# Patient Record
Sex: Female | Born: 1975 | Race: White | Hispanic: No | Marital: Married | Smoking: Never smoker
Health system: Southern US, Community
[De-identification: ages and names within clinical notes are randomized; demographics above are authoritative.]

## PROBLEM LIST (undated history)

## (undated) DIAGNOSIS — Z87448 Personal history of other diseases of urinary system: Secondary | ICD-10-CM

## (undated) DIAGNOSIS — Z8619 Personal history of other infectious and parasitic diseases: Secondary | ICD-10-CM

## (undated) DIAGNOSIS — K219 Gastro-esophageal reflux disease without esophagitis: Secondary | ICD-10-CM

## (undated) HISTORY — PX: OTHER SURGICAL HISTORY: SHX169

## (undated) HISTORY — DX: Gastro-esophageal reflux disease without esophagitis: K21.9

## (undated) HISTORY — DX: Personal history of other infectious and parasitic diseases: Z86.19

## (undated) HISTORY — PX: DILATION AND CURETTAGE OF UTERUS: SHX78

## (undated) HISTORY — PX: MOUTH SURGERY: SHX715

## (undated) HISTORY — DX: Personal history of other diseases of urinary system: Z87.448

---

## 2000-10-13 ENCOUNTER — Other Ambulatory Visit: Admission: RE | Admit: 2000-10-13 | Discharge: 2000-10-13 | Payer: Self-pay | Admitting: Obstetrics and Gynecology

## 2001-10-28 ENCOUNTER — Other Ambulatory Visit: Admission: RE | Admit: 2001-10-28 | Discharge: 2001-10-28 | Payer: Self-pay | Admitting: Obstetrics and Gynecology

## 2003-12-15 ENCOUNTER — Other Ambulatory Visit: Admission: RE | Admit: 2003-12-15 | Discharge: 2003-12-15 | Payer: Self-pay | Admitting: Obstetrics and Gynecology

## 2005-01-14 ENCOUNTER — Other Ambulatory Visit: Admission: RE | Admit: 2005-01-14 | Discharge: 2005-01-14 | Payer: Self-pay | Admitting: Obstetrics and Gynecology

## 2006-10-28 ENCOUNTER — Encounter: Admission: RE | Admit: 2006-10-28 | Discharge: 2006-10-28 | Payer: Self-pay | Admitting: Orthopedic Surgery

## 2006-11-03 ENCOUNTER — Encounter: Admission: RE | Admit: 2006-11-03 | Discharge: 2006-11-03 | Payer: Self-pay | Admitting: Orthopedic Surgery

## 2007-09-30 ENCOUNTER — Encounter: Admission: RE | Admit: 2007-09-30 | Discharge: 2007-09-30 | Payer: Self-pay | Admitting: Internal Medicine

## 2009-11-21 ENCOUNTER — Ambulatory Visit: Payer: Self-pay | Admitting: Internal Medicine

## 2010-08-03 ENCOUNTER — Inpatient Hospital Stay (HOSPITAL_COMMUNITY)
Admission: AD | Admit: 2010-08-03 | Discharge: 2010-08-04 | Disposition: A | Discharge status: Home / Self Care | Payer: 59 | Source: Ambulatory Visit | Attending: Obstetrics and Gynecology | Admitting: Obstetrics and Gynecology

## 2010-08-03 DIAGNOSIS — O479 False labor, unspecified: Secondary | ICD-10-CM | POA: Insufficient documentation

## 2010-08-04 LAB — WET PREP, GENITAL: Yeast Wet Prep HPF POC: NONE SEEN

## 2010-08-06 ENCOUNTER — Encounter (HOSPITAL_COMMUNITY): Payer: Self-pay | Admitting: Obstetrics and Gynecology

## 2010-08-06 ENCOUNTER — Inpatient Hospital Stay (HOSPITAL_COMMUNITY)
Admission: AD | Admit: 2010-08-06 | Discharge: 2010-08-08 | DRG: 775 | Disposition: A | Discharge status: Home / Self Care | Payer: 59 | Source: Ambulatory Visit | Attending: Obstetrics and Gynecology | Admitting: Obstetrics and Gynecology

## 2010-08-06 LAB — CBC
HCT: 34.7 % — ABNORMAL LOW (ref 36.0–46.0)
Hemoglobin: 11.9 g/dL — ABNORMAL LOW (ref 12.0–15.0)
MCHC: 34.3 g/dL (ref 30.0–36.0)
MCV: 89.9 fL (ref 78.0–100.0)
RDW: 12.8 % (ref 11.5–15.5)
WBC: 8.7 10*3/uL (ref 4.0–10.5)

## 2010-08-07 LAB — CBC
HCT: 27.9 % — ABNORMAL LOW (ref 36.0–46.0)
MCH: 30.9 pg (ref 26.0–34.0)
MCV: 89.7 fL (ref 78.0–100.0)
Platelets: 179 10*3/uL (ref 150–400)
RBC: 3.11 MIL/uL — ABNORMAL LOW (ref 3.87–5.11)

## 2011-07-29 ENCOUNTER — Encounter: Payer: Self-pay | Admitting: Internal Medicine

## 2011-07-29 ENCOUNTER — Ambulatory Visit (INDEPENDENT_AMBULATORY_CARE_PROVIDER_SITE_OTHER): Payer: 59 | Admitting: Internal Medicine

## 2011-07-29 VITALS — BP 130/98 | HR 80 | Temp 98.9°F | Wt 155.5 lb

## 2011-07-29 DIAGNOSIS — J40 Bronchitis, not specified as acute or chronic: Secondary | ICD-10-CM

## 2011-07-29 DIAGNOSIS — J329 Chronic sinusitis, unspecified: Secondary | ICD-10-CM

## 2011-07-29 MED ORDER — METHYLPREDNISOLONE ACETATE 80 MG/ML IJ SUSP
80.0000 mg | Freq: Once | INTRAMUSCULAR | Status: AC
  Administered 2011-07-29: 80 mg via INTRAMUSCULAR

## 2011-08-08 ENCOUNTER — Other Ambulatory Visit: Payer: Self-pay

## 2011-08-08 ENCOUNTER — Telehealth: Payer: Self-pay | Admitting: Internal Medicine

## 2011-08-08 MED ORDER — CLARITHROMYCIN 500 MG PO TABS: 500.0000 mg | ORAL_TABLET | Freq: Two times a day (BID) | ORAL | Status: AC

## 2011-08-08 NOTE — Telephone Encounter (Signed)
Rx for Biaxin faxed to [facility] [address]  Pt aware.

## 2011-08-08 NOTE — Telephone Encounter (Signed)
Please call patient. Reconfirm what antibiotic she was given along with depomedrol injection. Does she have Hycodan or Tessalon perles from visit 2/4?

## 2011-08-09 DIAGNOSIS — J00 Acute nasopharyngitis [common cold]: Secondary | ICD-10-CM

## 2011-08-15 ENCOUNTER — Telehealth: Payer: Self-pay | Admitting: Internal Medicine

## 2011-08-15 NOTE — Telephone Encounter (Signed)
Please schedule office visit today or tomorrow please. MJB

## 2011-08-16 ENCOUNTER — Ambulatory Visit
Admission: RE | Admit: 2011-08-16 | Discharge: 2011-08-16 | Disposition: A | Discharge status: Home / Self Care | Payer: 59 | Source: Ambulatory Visit | Attending: Internal Medicine | Admitting: Internal Medicine

## 2011-08-16 ENCOUNTER — Ambulatory Visit (INDEPENDENT_AMBULATORY_CARE_PROVIDER_SITE_OTHER): Payer: 59 | Admitting: Internal Medicine

## 2011-08-16 ENCOUNTER — Encounter: Payer: Self-pay | Admitting: Internal Medicine

## 2011-08-16 VITALS — BP 124/92 | Temp 99.4°F | Wt 155.0 lb

## 2011-08-16 DIAGNOSIS — R05 Cough: Secondary | ICD-10-CM

## 2011-08-16 DIAGNOSIS — J40 Bronchitis, not specified as acute or chronic: Secondary | ICD-10-CM

## 2011-08-16 MED ORDER — CEFTRIAXONE SODIUM 1 G IJ SOLR
1.0000 g | Freq: Once | INTRAMUSCULAR | Status: AC
  Administered 2011-08-16: 1 g via INTRAMUSCULAR

## 2011-08-16 NOTE — Patient Instructions (Addendum)
Take Levaquin daily with a meal for 10 days for bronchitis call if not better in 7-10 days.

## 2011-08-16 NOTE — Progress Notes (Signed)
  Subjective:    Patient ID: Christine Fischer, female    DOB: 20-Sep-1975, 36 y.o.   MRN: [id number]  HPI Patient seen on February 4 for cough that has been going on since the first of the year and treated for bronchitis with Levaquin 500 mg for 10 days. Subsequently she called saying she was no better and was switched to Biaxin 500 mg twice daily. She called couple of days ago and said she was still coughing a great deal. Did not improve very much.    No history of asthma. Has been running for exercise and does well running but sometimes feels she has a little bit of issue getting her breath. Patient has Nuva Ring and is due to start menstrual period this coming weekend.    Review of Systems     Objective:   Physical Exam boggy nasal mucosa; TMs are slightly full but not red; neck supple without adenopathy; pharynx slightly injected. Chest clear. Patient has congested cough.        Assessment & Plan:  Bronchitis  Possible allergic rhinitis  Patient sent for chest x-ray which proved to be negative-patient informed by telephone.  Plan: Sterapred DS 10 mg 6 day dosepak. Avelox 400 mg daily for 7 days.  If cough does not improve, refer to allergist.

## 2011-08-20 NOTE — Progress Notes (Signed)
  Subjective:    Patient ID: Christine Fischer, female    DOB: [birth year], 36 y.o.   MRN: [id number]  HPI 36 year old white female married and mother of a daughter about 40-year-old has been coughing since around [place]. Coughing is been persistent and is aggravating. Not much sputum production. General health is good. No fever or shaking chills. No flulike symptoms. No sore throat. Patient does run for exercise. Denies wheezing with exercise. O2 sat is 99% on room air.    Review of Systems     Objective:   Physical Exam HEENT exam: TMs and pharynx are clear; neck supple; chest clear to auscultation        Assessment & Plan:  Bronchitis  Plan: Levaquin 500 milligrams daily for 10 days.

## 2011-08-20 NOTE — Patient Instructions (Signed)
Take Avelox 400 mg daily for 7 days. Take Sterapred DS 10 mg 6 day dosepak.

## 2012-06-24 NOTE — L&D Delivery Note (Signed)
Delivery Note At 12:49 PM a viable female was delivered via OA Presentation Apgars 9 and 9  Placenta status: delivered spontaneously  .  Cord:  with the following complications: none  Cord pH: not obtained  Anesthesia:  epidural Episiotomy: none Lacerations: first Suture Repair: 3.0 chromic Est. Blood Loss (mL): 300  Mom to postpartum.  Baby to Couplet care / Skin to Skin.  Christine Fischer L 05/13/2013, 1:10 PM

## 2012-10-05 LAB — OB RESULTS CONSOLE HEPATITIS B SURFACE ANTIGEN: Hepatitis B Surface Ag: NEGATIVE

## 2012-10-05 LAB — OB RESULTS CONSOLE RPR: RPR: NONREACTIVE

## 2012-10-05 LAB — OB RESULTS CONSOLE GC/CHLAMYDIA: Chlamydia: NEGATIVE

## 2012-10-05 LAB — OB RESULTS CONSOLE ABO/RH: RH Type: POSITIVE

## 2012-10-05 LAB — OB RESULTS CONSOLE RUBELLA ANTIBODY, IGM: Rubella: IMMUNE

## 2013-05-09 ENCOUNTER — Inpatient Hospital Stay (HOSPITAL_COMMUNITY): Admission: AD | Admit: 2013-05-09 | Payer: Self-pay | Source: Ambulatory Visit | Admitting: Obstetrics and Gynecology

## 2013-05-11 ENCOUNTER — Encounter (HOSPITAL_COMMUNITY): Payer: Self-pay | Admitting: *Deleted

## 2013-05-11 ENCOUNTER — Telehealth (HOSPITAL_COMMUNITY): Payer: Self-pay | Admitting: *Deleted

## 2013-05-11 NOTE — Telephone Encounter (Signed)
Preadmission screen  

## 2013-05-13 ENCOUNTER — Inpatient Hospital Stay (HOSPITAL_COMMUNITY): Payer: 59 | Admitting: Anesthesiology

## 2013-05-13 ENCOUNTER — Encounter (HOSPITAL_COMMUNITY): Payer: Self-pay

## 2013-05-13 ENCOUNTER — Encounter (HOSPITAL_COMMUNITY): Payer: 59 | Admitting: Anesthesiology

## 2013-05-13 ENCOUNTER — Inpatient Hospital Stay (HOSPITAL_COMMUNITY)
Admission: RE | Admit: 2013-05-13 | Discharge: 2013-05-15 | DRG: 775 | Disposition: A | Discharge status: Home / Self Care | Payer: 59 | Source: Ambulatory Visit | Attending: Obstetrics and Gynecology | Admitting: Obstetrics and Gynecology

## 2013-05-13 DIAGNOSIS — Z22330 Carrier of Group B streptococcus: Secondary | ICD-10-CM

## 2013-05-13 DIAGNOSIS — O99892 Other specified diseases and conditions complicating childbirth: Secondary | ICD-10-CM | POA: Diagnosis present

## 2013-05-13 DIAGNOSIS — O480 Post-term pregnancy: Principal | ICD-10-CM | POA: Diagnosis present

## 2013-05-13 DIAGNOSIS — O09529 Supervision of elderly multigravida, unspecified trimester: Secondary | ICD-10-CM | POA: Diagnosis present

## 2013-05-13 LAB — CBC
Hemoglobin: 12 g/dL (ref 12.0–15.0)
MCH: 31.2 pg (ref 26.0–34.0)
MCHC: 35.9 g/dL (ref 30.0–36.0)
MCV: 86.8 fL (ref 78.0–100.0)
RDW: 12.9 % (ref 11.5–15.5)

## 2013-05-13 LAB — RPR: RPR Ser Ql: NONREACTIVE

## 2013-05-13 MED ORDER — FENTANYL 2.5 MCG/ML BUPIVACAINE 1/10 % EPIDURAL INFUSION (WH - ANES)
14.0000 mL/h | INTRAMUSCULAR | Status: DC | PRN
  Filled 2013-05-13: qty 125

## 2013-05-13 MED ORDER — PHENYLEPHRINE 40 MCG/ML (10ML) SYRINGE FOR IV PUSH (FOR BLOOD PRESSURE SUPPORT)
80.0000 ug | PREFILLED_SYRINGE | INTRAVENOUS | Status: DC | PRN
  Filled 2013-05-13: qty 2

## 2013-05-13 MED ORDER — MEASLES, MUMPS & RUBELLA VAC ~~LOC~~ INJ
0.5000 mL | INJECTION | Freq: Once | SUBCUTANEOUS | Status: DC
  Filled 2013-05-13: qty 0.5

## 2013-05-13 MED ORDER — LACTATED RINGERS IV SOLN
INTRAVENOUS | Status: DC
  Administered 2013-05-13: 08:00:00 via INTRAVENOUS

## 2013-05-13 MED ORDER — FLEET ENEMA 7-19 GM/118ML RE ENEM: 1.0000 | ENEMA | RECTAL | Status: DC | PRN

## 2013-05-13 MED ORDER — OXYCODONE-ACETAMINOPHEN 5-325 MG PO TABS: 1.0000 | ORAL_TABLET | ORAL | Status: DC | PRN

## 2013-05-13 MED ORDER — OXYTOCIN BOLUS FROM INFUSION: 500.0000 mL | INTRAVENOUS | Status: DC

## 2013-05-13 MED ORDER — LANOLIN HYDROUS EX OINT: TOPICAL_OINTMENT | CUTANEOUS | Status: DC | PRN

## 2013-05-13 MED ORDER — DEXTROSE 5 % IV SOLN
5.0000 10*6.[IU] | Freq: Once | INTRAVENOUS | Status: AC
  Administered 2013-05-13: 5 10*6.[IU] via INTRAVENOUS
  Filled 2013-05-13: qty 5

## 2013-05-13 MED ORDER — LIDOCAINE HCL (PF) 1 % IJ SOLN
30.0000 mL | INTRAMUSCULAR | Status: DC | PRN
  Filled 2013-05-13: qty 30

## 2013-05-13 MED ORDER — PENICILLIN G POTASSIUM 5000000 UNITS IJ SOLR
2.5000 10*6.[IU] | INTRAVENOUS | Status: DC
  Administered 2013-05-13: 2.5 10*6.[IU] via INTRAVENOUS
  Filled 2013-05-13 (×5): qty 2.5

## 2013-05-13 MED ORDER — DIPHENHYDRAMINE HCL 50 MG/ML IJ SOLN: 12.5000 mg | INTRAMUSCULAR | Status: DC | PRN

## 2013-05-13 MED ORDER — IBUPROFEN 600 MG PO TABS: 600.0000 mg | ORAL_TABLET | Freq: Four times a day (QID) | ORAL | Status: DC | PRN

## 2013-05-13 MED ORDER — WITCH HAZEL-GLYCERIN EX PADS: 1.0000 "application " | MEDICATED_PAD | CUTANEOUS | Status: DC | PRN

## 2013-05-13 MED ORDER — MEDROXYPROGESTERONE ACETATE 150 MG/ML IM SUSP: 150.0000 mg | INTRAMUSCULAR | Status: DC | PRN

## 2013-05-13 MED ORDER — OXYTOCIN 40 UNITS IN LACTATED RINGERS INFUSION - SIMPLE MED
62.5000 mL/h | INTRAVENOUS | Status: DC
  Filled 2013-05-13: qty 1000

## 2013-05-13 MED ORDER — LACTATED RINGERS IV SOLN: 500.0000 mL | INTRAVENOUS | Status: DC | PRN

## 2013-05-13 MED ORDER — PRENATAL MULTIVITAMIN CH
1.0000 | ORAL_TABLET | Freq: Every day | ORAL | Status: DC
  Administered 2013-05-14: 1 via ORAL
  Filled 2013-05-13: qty 1

## 2013-05-13 MED ORDER — BISACODYL 10 MG RE SUPP: 10.0000 mg | Freq: Every day | RECTAL | Status: DC | PRN

## 2013-05-13 MED ORDER — SIMETHICONE 80 MG PO CHEW: 80.0000 mg | CHEWABLE_TABLET | ORAL | Status: DC | PRN

## 2013-05-13 MED ORDER — SENNOSIDES-DOCUSATE SODIUM 8.6-50 MG PO TABS
2.0000 | ORAL_TABLET | ORAL | Status: DC
  Administered 2013-05-14 – 2013-05-15 (×2): 2 via ORAL
  Filled 2013-05-13 (×2): qty 2

## 2013-05-13 MED ORDER — LIDOCAINE HCL (PF) 1 % IJ SOLN
INTRAMUSCULAR | Status: DC | PRN
  Administered 2013-05-13: 4 mL
  Administered 2013-05-13: 5 mL

## 2013-05-13 MED ORDER — FENTANYL 2.5 MCG/ML BUPIVACAINE 1/10 % EPIDURAL INFUSION (WH - ANES)
INTRAMUSCULAR | Status: DC | PRN
  Administered 2013-05-13: 14 mL/h via EPIDURAL

## 2013-05-13 MED ORDER — ONDANSETRON HCL 4 MG PO TABS: 4.0000 mg | ORAL_TABLET | ORAL | Status: DC | PRN

## 2013-05-13 MED ORDER — DIPHENHYDRAMINE HCL 25 MG PO CAPS: 25.0000 mg | ORAL_CAPSULE | Freq: Four times a day (QID) | ORAL | Status: DC | PRN

## 2013-05-13 MED ORDER — IBUPROFEN 600 MG PO TABS
600.0000 mg | ORAL_TABLET | Freq: Four times a day (QID) | ORAL | Status: DC
  Administered 2013-05-13 – 2013-05-15 (×7): 600 mg via ORAL
  Filled 2013-05-13 (×7): qty 1

## 2013-05-13 MED ORDER — ZOLPIDEM TARTRATE 5 MG PO TABS: 5.0000 mg | ORAL_TABLET | Freq: Every evening | ORAL | Status: DC | PRN

## 2013-05-13 MED ORDER — CITRIC ACID-SODIUM CITRATE 334-500 MG/5ML PO SOLN: 30.0000 mL | ORAL | Status: DC | PRN

## 2013-05-13 MED ORDER — LACTATED RINGERS IV SOLN: 500.0000 mL | Freq: Once | INTRAVENOUS | Status: DC

## 2013-05-13 MED ORDER — EPHEDRINE 5 MG/ML INJ
10.0000 mg | INTRAVENOUS | Status: DC | PRN
  Filled 2013-05-13: qty 2

## 2013-05-13 MED ORDER — BENZOCAINE-MENTHOL 20-0.5 % EX AERO
1.0000 "application " | INHALATION_SPRAY | CUTANEOUS | Status: DC | PRN
  Filled 2013-05-13: qty 56

## 2013-05-13 MED ORDER — ACETAMINOPHEN 325 MG PO TABS: 650.0000 mg | ORAL_TABLET | ORAL | Status: DC | PRN

## 2013-05-13 MED ORDER — ONDANSETRON HCL 4 MG/2ML IJ SOLN: 4.0000 mg | Freq: Four times a day (QID) | INTRAMUSCULAR | Status: DC | PRN

## 2013-05-13 MED ORDER — PHENYLEPHRINE 40 MCG/ML (10ML) SYRINGE FOR IV PUSH (FOR BLOOD PRESSURE SUPPORT)
80.0000 ug | PREFILLED_SYRINGE | INTRAVENOUS | Status: DC | PRN
  Filled 2013-05-13: qty 10
  Filled 2013-05-13: qty 2

## 2013-05-13 MED ORDER — FLEET ENEMA 7-19 GM/118ML RE ENEM: 1.0000 | ENEMA | Freq: Every day | RECTAL | Status: DC | PRN

## 2013-05-13 MED ORDER — DIBUCAINE 1 % RE OINT: 1.0000 "application " | TOPICAL_OINTMENT | RECTAL | Status: DC | PRN

## 2013-05-13 MED ORDER — EPHEDRINE 5 MG/ML INJ
10.0000 mg | INTRAVENOUS | Status: DC | PRN
  Filled 2013-05-13: qty 2
  Filled 2013-05-13: qty 4

## 2013-05-13 MED ORDER — TETANUS-DIPHTH-ACELL PERTUSSIS 5-2.5-18.5 LF-MCG/0.5 IM SUSP: 0.5000 mL | Freq: Once | INTRAMUSCULAR | Status: DC

## 2013-05-13 MED ORDER — OXYTOCIN 40 UNITS IN LACTATED RINGERS INFUSION - SIMPLE MED
1.0000 m[IU]/min | INTRAVENOUS | Status: DC
  Administered 2013-05-13: 2 m[IU]/min via INTRAVENOUS

## 2013-05-13 MED ORDER — ONDANSETRON HCL 4 MG/2ML IJ SOLN: 4.0000 mg | INTRAMUSCULAR | Status: DC | PRN

## 2013-05-13 NOTE — Anesthesia Preprocedure Evaluation (Signed)
Anesthesia Evaluation  Patient identified by MRN, date of birth, ID band Patient awake    Reviewed: Allergy & Precautions, H&P , Patient's Chart, lab work & pertinent test results  Airway Mallampati: II TM Distance: >3 FB Neck ROM: Full    Dental no notable dental hx. (+) Teeth Intact   Pulmonary neg pulmonary ROS,  breath sounds clear to auscultation  Pulmonary exam normal       Cardiovascular negative cardio ROS  Rhythm:Regular Rate:Normal     Neuro/Psych negative neurological ROS  negative psych ROS   GI/Hepatic Neg liver ROS, GERD-  Medicated and Controlled,  Endo/Other  negative endocrine ROS  Renal/GU Renal diseaseHx/o Pyelonephritis   negative genitourinary   Musculoskeletal negative musculoskeletal ROS (+)   Abdominal   Peds  Hematology negative hematology ROS (+)   Anesthesia Other Findings   Reproductive/Obstetrics (+) Pregnancy                           Anesthesia Physical Anesthesia Plan  ASA: II  Anesthesia Plan: Epidural   Post-op Pain Management:    Induction:   Airway Management Planned: Natural Airway  Additional Equipment:   Intra-op Plan:   Post-operative Plan:   Informed Consent: I have reviewed the patients History and Physical, chart, labs and discussed the procedure including the risks, benefits and alternatives for the proposed anesthesia with the patient or authorized representative who has indicated his/her understanding and acceptance.     Plan Discussed with: Anesthesiologist  Anesthesia Plan Comments:         Anesthesia Quick Evaluation

## 2013-05-13 NOTE — Anesthesia Procedure Notes (Signed)
Epidural Patient location during procedure: OB Start time: 05/13/2013 9:42 AM  Staffing Anesthesiologist: Venida Tsukamoto A. Performed by: anesthesiologist   Preanesthetic Checklist Completed: patient identified, site marked, surgical consent, pre-op evaluation, timeout performed, IV checked, risks and benefits discussed and monitors and equipment checked  Epidural Patient position: sitting Prep: site prepped and draped and DuraPrep Patient monitoring: continuous pulse ox and blood pressure Approach: midline Injection technique: LOR air  Needle:  Needle type: Tuohy  Needle gauge: 17 G Needle length: 9 cm and 9 Needle insertion depth: 4 cm Catheter type: closed end flexible Catheter size: 19 Gauge Catheter at skin depth: 9 cm Test dose: negative and Other  Assessment Events: blood not aspirated, injection not painful, no injection resistance, negative IV test and no paresthesia  Additional Notes Patient identified. Risks and benefits discussed including failed block, incomplete  Pain control, post dural puncture headache, nerve damage, paralysis, blood pressure Changes, nausea, vomiting, reactions to medications-both toxic and allergic and post Partum back pain. All questions were answered. Patient expressed understanding and wished to proceed. Sterile technique was used throughout procedure. Epidural site was Dressed with sterile barrier dressing. No paresthesias, signs of intravascular injection Or signs of intrathecal spread were encountered.  Patient was more comfortable after the epidural was dosed. Please see RN's note for documentation of vital signs and FHR which are stable.

## 2013-05-13 NOTE — H&P (Signed)
Christine Fischer is a 37 y.o. female presenting for induction of labor secondary to post dates Maternal Medical History:  Fetal activity: Perceived fetal activity is normal.    Prenatal complications: no prenatal complications   OB History   Grav Para Term Preterm Abortions TAB SAB Ect Mult Living   3 1 1  1 1    1      Past Medical History  Diagnosis Date  . GERD (gastroesophageal reflux disease)   . Hx of varicella   . Hx of pyelonephritis    Past Surgical History  Procedure Laterality Date  . Dilation and curettage of uterus    . Mouth surgery    . Mole excision     Family History: family history includes Cancer in her maternal aunt and mother; Cerebral palsy in her maternal aunt. Social History:  reports that she has never smoked. She has never used smokeless tobacco. She reports that she drinks alcohol. She reports that she does not use illicit drugs.   Prenatal Transfer Tool  Maternal Diabetes: No Genetic Screening: Normal Maternal Ultrasounds/Referrals: Normal Fetal Ultrasounds or other Referrals:  None Maternal Substance Abuse:  No Significant Maternal Medications:  None Significant Maternal Lab Results:  None Other Comments:  None  Review of Systems  All other systems reviewed and are negative.    Dilation: 4 Effacement (%): 80 Station: -3 Exam by:: Jerriann Schrom Last menstrual period 08/05/2012, unknown if currently breastfeeding. Maternal Exam:  Uterine Assessment: Contraction strength is mild.  Contraction frequency is irregular.   Abdomen: Fetal presentation: vertex  Introitus: not evaluated.     Fetal Exam Fetal Monitor Review: Variability: moderate (6-25 bpm).   Pattern: accelerations present.    Fetal State Assessment: Category I - tracings are normal.     Physical Exam  Nursing note and vitals reviewed. Constitutional: She appears well-developed.  HENT:  Head: Normocephalic.  Eyes: Pupils are equal, round, and reactive to light.   Neck: Normal range of motion.  Cardiovascular: Normal rate.   Respiratory: Effort normal and breath sounds normal.  GI: Soft.  Genitourinary: Vagina normal.    Prenatal labs: ABO, Rh: A/Positive/-- (04/14 0000) Antibody: Negative (04/14 0000) Rubella: Immune (04/14 0000) RPR: Nonreactive (04/14 0000)  HBsAg: Negative (04/14 0000)  HIV: Non-reactive (04/14 0000)  GBS: Positive (10/22 0000)   Assessment/Plan: IUP at 40 w 1 day GBBS + Admit Penicillin Pitocin - risks reviewed Epidural prn   Tery Hoeger L 05/13/2013, 7:58 AM

## 2013-05-13 NOTE — Lactation Note (Signed)
This note was copied from the chart of Christine Ermalinda Joubert. Lactation Consultation Note  Patient Name: Christine Fischer [id number] Date: 05/13/2013 Reason for consult: Initial assessment of this second-time mother and her newborn at 7 hours of age. She is an experienced breastfeeding mom; breastfed her 37 yo for 7 1/2 months but difficult maintaining milk production after return to work (encouraged to call [facility] or attend support group as needed).  Mom states she knows how to express colostrum/milk on nipples as needed.  LC encouraged STS and cue feedings.  LC encouraged review of Baby and Me pp 14 and 20-25 for STS and BF information. LC provided [employer] brochure and reviewed [facility] services and list of community and web site resources.    Maternal Data Formula Feeding for Exclusion: No Infant to breast within first hour of birth: Yes (initial LATCH score=10) Has patient been taught Hand Expression?: Yes (experienced mom; states she knows how to hand express) Does the patient have breastfeeding experience prior to this delivery?: Yes  Feeding    LATCH Score/Interventions           initial LATCH score=10           Lactation Tools Discussed/Used   STS, hand expression, cue feedings  Consult Status Consult Status: Follow-up Date: 05/14/13 Follow-up type: In-patient    Warrick Parisian [facility] 05/13/2013, 8:34 PM

## 2013-05-14 LAB — CBC
HCT: 31.6 % — ABNORMAL LOW (ref 36.0–46.0)
Hemoglobin: 11 g/dL — ABNORMAL LOW (ref 12.0–15.0)
MCHC: 34.8 g/dL (ref 30.0–36.0)
RBC: 3.62 MIL/uL — ABNORMAL LOW (ref 3.87–5.11)

## 2013-05-14 NOTE — Lactation Note (Signed)
This note was copied from the chart of Christine Fischer. Lactation Consultation Note  Patient Name: Christine Fischer Date: 05/14/2013 Reason for consult: Follow-up assessment Mom reports baby is nursing well, some mild nipple tenderness, denies breakdown. Reviewed importance of good support and deep latch to prevent soreness and milk transfer. Baby asleep at this visit, did not see latch. Care for sore nipples reviewed, Advised to apply EBM. Engorgement care reviewed if needed. Advised to call if would like assist with latch.   Maternal Data    Feeding Feeding Type: Breast Fed Length of feed: 10 min  LATCH Score/Interventions Latch: Repeated attempts needed to sustain latch, nipple held in mouth throughout feeding, stimulation needed to elicit sucking reflex. Intervention(s): Adjust position;Assist with latch;Breast compression;Breast massage  Audible Swallowing: None Intervention(s): Skin to skin;Hand expression Intervention(s): Skin to skin;Hand expression;Alternate breast massage  Type of Nipple: Everted at rest and after stimulation  Comfort (Breast/Nipple): Soft / non-tender     Hold (Positioning): No assistance needed to correctly position infant at breast.  LATCH Score: 7  Lactation Tools Discussed/Used     Consult Status Consult Status: PRN Date: 05/15/13 Follow-up type: In-patient    Alfred Levins 05/14/2013, 8:52 PM

## 2013-05-14 NOTE — Anesthesia Postprocedure Evaluation (Signed)
  Anesthesia Post-op Note  Patient: Christine Fischer  Procedure(s) Performed: * No procedures listed *  Patient Location: PACU  Anesthesia Type:Epidural  Level of Consciousness: awake, alert  and oriented  Airway and Oxygen Therapy: Patient Spontanous Breathing  Post-op Pain: none  Post-op Assessment: Post-op Vital signs reviewed, Patient's Cardiovascular Status Stable, Respiratory Function Stable, Patent Airway, No signs of Nausea or vomiting, Adequate PO intake, Pain level controlled, No headache, No backache, No residual numbness and No residual motor weakness  Post-op Vital Signs: Reviewed and stable  Complications: No apparent anesthesia complications

## 2013-05-14 NOTE — Procedures (Signed)
Informed consent obtained and verified.  Alcohol prep and dorsal block with 1% lidocaine.  Betadine prep and sterile drape.  Circ done with 1.1 Gomco.  No complications 

## 2013-05-14 NOTE — Progress Notes (Signed)
Post Partum Day 1 Subjective: no complaints, up ad lib, voiding, tolerating PO and + flatus  Objective: Blood pressure 117/69, pulse 67, temperature 97.7 F (36.5 C), temperature source Oral, resp. rate 18, height 5\' 8"  (1.727 m), weight 192 lb (87.091 kg), last menstrual period 08/05/2012, SpO2 100.00%, unknown if currently breastfeeding.  Physical Exam:  General: alert and cooperative Lochia: appropriate Uterine Fundus: firm Incision: perineum intact DVT Evaluation: No evidence of DVT seen on physical exam. Negative Homan's sign. No cords or calf tenderness. No significant calf/ankle edema.   Recent Labs  05/13/13 0745 05/14/13 0618  HGB 12.0 11.0*  HCT 33.4* 31.6*    Assessment/Plan: Plan for discharge tomorrow and Circumcision prior to discharge   LOS: 1 day   Yojan Paskett G 05/14/2013, 8:18 AM

## 2013-05-14 NOTE — Progress Notes (Signed)
Agree with above. Discussed circ with pt including risk of bleeding, infection, scarring and damage to penis.  Informed consent

## 2013-05-15 MED ORDER — IBUPROFEN 600 MG PO TABS: 600.0000 mg | ORAL_TABLET | Freq: Four times a day (QID) | ORAL | Status: DC

## 2013-05-15 NOTE — Discharge Summary (Signed)
Obstetric Discharge Summary Reason for Admission: induction of labor Prenatal Procedures: ultrasound Intrapartum Procedures: spontaneous vaginal delivery Postpartum Procedures: none Complications-Operative and Postpartum: 1 degree perineal laceration Hemoglobin  Date Value Range Status  05/14/2013 11.0* 12.0 - 15.0 g/dL Final     HCT  Date Value Range Status  05/14/2013 31.6* 36.0 - 46.0 % Final    Physical Exam:  General: alert and cooperative Lochia: appropriate Uterine Fundus: firm Incision: n/a DVT Evaluation: No evidence of DVT seen on physical exam.  Discharge Diagnoses: Term Pregnancy-delivered  Discharge Information: Date: 05/15/2013 Activity: pelvic rest Diet: routine Medications: PNV and Ibuprofen Condition: stable Instructions: refer to practice specific booklet Discharge to: home   Newborn Data: Live born female  Birth Weight: 7 lb 9 oz (3430 g) APGAR: 8, 9  Home with mother.  Christine Fischer 05/15/2013, 8:49 AM

## 2013-05-28 ENCOUNTER — Ambulatory Visit (HOSPITAL_COMMUNITY)
Admission: RE | Admit: 2013-05-28 | Discharge: 2013-05-28 | Disposition: A | Discharge status: Home / Self Care | Payer: 59 | Source: Ambulatory Visit | Attending: Obstetrics and Gynecology | Admitting: Obstetrics and Gynecology

## 2013-05-28 NOTE — Lactation Note (Signed)
Infant Lactation Consultation Outpatient Visit Note    Weight was 7+11.5 on Tuesday.  Mom is here to day for soreness of the right nipple.  She have a small positional stripe on it.  Patient Name: PREETHI SCANTLEBURY Date of Birth: 1976-02-28 Birth Weight:  7# 9 oz Gestational Age at Delivery: Gestational Age: <None> Type of Delivery: VAG  Breastfeeding History Frequency of Breastfeeding: 8 times in 24 hours Length of Feeding: 40 min Voids: 6+ Stools: 4-5  Supplementing / Method: Pumping:  Type of Pump:occasionally to help empty breasts   Frequency:  Volume:  3.5 oz  Comments:    Consultation Evaluation:  Initial Feeding Assessment: Pre-feed [id number] 7+14..2  Post-feed Weight:3600 Amount Transferred:22 Comments:Helped mom with good support and off-center latch and explained rationale.  She reported that pain decreased from an 8 on the pain scale to a 2.5 and it ceased.  Additional Feeding Assessment: Pre-feed Weight:3600 Post-feed [id number] Amount Transferred:38 Comments: Mom reports that Whit ate less than 2 hours ago.  Additional Feeding Assessment: Pre-feed [id number]:[id number] Post-feed ONGEXB:2841 Amount Transferred:10 Comments:  Total Breast milk Transferred this Visit: 48 in 40 minutes. Total Supplement Given:   Additional Interventions:   Follow-Up   Smart start in 10 days.   Soyla Dryer 05/28/2013, 9:11 AM

## 2013-07-08 ENCOUNTER — Encounter: Payer: Self-pay | Admitting: Internal Medicine

## 2013-07-08 ENCOUNTER — Ambulatory Visit (INDEPENDENT_AMBULATORY_CARE_PROVIDER_SITE_OTHER): Payer: BC Managed Care – PPO | Admitting: Internal Medicine

## 2013-07-08 VITALS — BP 122/84 | HR 76 | Temp 98.9°F | Wt 173.5 lb

## 2013-07-08 DIAGNOSIS — J209 Acute bronchitis, unspecified: Secondary | ICD-10-CM

## 2013-07-08 DIAGNOSIS — H6693 Otitis media, unspecified, bilateral: Secondary | ICD-10-CM

## 2013-07-08 DIAGNOSIS — H6690 Otitis media, unspecified, unspecified ear: Secondary | ICD-10-CM

## 2013-07-08 MED ORDER — AZITHROMYCIN 250 MG PO TABS: ORAL_TABLET | ORAL | Status: DC

## 2013-07-08 NOTE — Patient Instructions (Addendum)
Take Zithromax as directed. Take Delsym as directed. Call if not better in one week.

## 2013-07-08 NOTE — Progress Notes (Signed)
   Subjective:    Patient ID: Christine Fischer, female    DOB: 1976/06/18, 38 y.o.   MRN: [id number]  HPI Breastfeeding 308 week old infant. Onset this past weekend of dry cough. Lots of coughing mostly. No fever or chills. No ear pain. Some postnasal drip. Tried Mucinex. No myalgias. Had some laryngitis.    Review of Systems     Objective:   Physical Exam  HEENT: TMs dull bilaterally. Pharynx clear. Coughing constantly. Neck is supple. Chest clear to auscultation without rales or wheezing        Assessment & Plan:   Bronchitis  Breast-feeding  Acute bilateral otitis media  Plan: Zithromax Z-Pak take 2 tablets day one followed by 1 tablet days 2 through 5. Take Delsym as needed for cough. Call if not better in one week.

## 2014-04-05 ENCOUNTER — Other Ambulatory Visit: Payer: Self-pay | Admitting: Obstetrics and Gynecology

## 2014-04-05 DIAGNOSIS — R928 Other abnormal and inconclusive findings on diagnostic imaging of breast: Secondary | ICD-10-CM

## 2014-04-12 ENCOUNTER — Ambulatory Visit
Admission: RE | Admit: 2014-04-12 | Discharge: 2014-04-12 | Disposition: A | Discharge status: Home / Self Care | Payer: BC Managed Care – PPO | Source: Ambulatory Visit | Attending: Obstetrics and Gynecology | Admitting: Obstetrics and Gynecology

## 2014-04-12 ENCOUNTER — Encounter (INDEPENDENT_AMBULATORY_CARE_PROVIDER_SITE_OTHER): Payer: Self-pay

## 2014-04-12 DIAGNOSIS — R928 Other abnormal and inconclusive findings on diagnostic imaging of breast: Secondary | ICD-10-CM

## 2014-04-25 ENCOUNTER — Encounter: Payer: Self-pay | Admitting: Internal Medicine

## 2014-06-24 NOTE — L&D Delivery Note (Signed)
SVD of VFI at 1307 on 06/23/15.  EBL 250cc.  APGARs 8,9.  Placenta to L&D. Head delivered LOA and body followed atraumatically.  Baby to abdomen.  Cord was clamped and cut.  Cord blood was obtained.  Placenta delivered S/I/3VC.  Fundus was firmed with pitocin and massage.  First degree lac was repaired with 3-0 Vicryl with since figure of eight stitch.  Mom and baby stable.   Mitchel HonourMegan Kerrion Kemppainen, DO

## 2014-11-15 LAB — OB RESULTS CONSOLE ABO/RH: RH TYPE: POSITIVE

## 2014-11-15 LAB — OB RESULTS CONSOLE HIV ANTIBODY (ROUTINE TESTING): HIV: NONREACTIVE

## 2014-11-15 LAB — OB RESULTS CONSOLE HEPATITIS B SURFACE ANTIGEN: Hepatitis B Surface Ag: NEGATIVE

## 2014-11-15 LAB — OB RESULTS CONSOLE RUBELLA ANTIBODY, IGM: Rubella: IMMUNE

## 2014-11-15 LAB — OB RESULTS CONSOLE GC/CHLAMYDIA
Chlamydia: NEGATIVE
Gonorrhea: NEGATIVE

## 2014-11-15 LAB — OB RESULTS CONSOLE RPR: RPR: NONREACTIVE

## 2014-11-15 LAB — OB RESULTS CONSOLE ANTIBODY SCREEN: Antibody Screen: NEGATIVE

## 2015-05-31 LAB — OB RESULTS CONSOLE GBS: GBS: NEGATIVE

## 2015-06-20 ENCOUNTER — Encounter (HOSPITAL_COMMUNITY): Payer: Self-pay | Admitting: *Deleted

## 2015-06-20 ENCOUNTER — Telehealth (HOSPITAL_COMMUNITY): Payer: Self-pay | Admitting: *Deleted

## 2015-06-20 NOTE — Telephone Encounter (Signed)
Preadmission screen  

## 2015-06-22 ENCOUNTER — Inpatient Hospital Stay (HOSPITAL_COMMUNITY): Admission: AD | Admit: 2015-06-22 | Payer: Self-pay | Source: Ambulatory Visit | Admitting: Obstetrics and Gynecology

## 2015-06-22 ENCOUNTER — Other Ambulatory Visit (HOSPITAL_COMMUNITY): Payer: Self-pay | Admitting: Obstetrics and Gynecology

## 2015-06-22 ENCOUNTER — Telehealth (HOSPITAL_COMMUNITY): Payer: Self-pay | Admitting: *Deleted

## 2015-06-22 ENCOUNTER — Encounter (HOSPITAL_COMMUNITY): Payer: Self-pay | Admitting: *Deleted

## 2015-06-22 NOTE — Telephone Encounter (Signed)
Preadmission screen  

## 2015-06-23 ENCOUNTER — Inpatient Hospital Stay (HOSPITAL_COMMUNITY)
Admission: RE | Admit: 2015-06-23 | Discharge: 2015-06-24 | DRG: 775 | Disposition: A | Discharge status: Home / Self Care | Payer: BLUE CROSS/BLUE SHIELD | Source: Ambulatory Visit | Attending: Obstetrics & Gynecology | Admitting: Obstetrics & Gynecology

## 2015-06-23 ENCOUNTER — Encounter (HOSPITAL_COMMUNITY): Payer: Self-pay

## 2015-06-23 ENCOUNTER — Inpatient Hospital Stay (HOSPITAL_COMMUNITY): Payer: BLUE CROSS/BLUE SHIELD | Admitting: Anesthesiology

## 2015-06-23 DIAGNOSIS — Z3A39 39 weeks gestation of pregnancy: Secondary | ICD-10-CM

## 2015-06-23 DIAGNOSIS — K219 Gastro-esophageal reflux disease without esophagitis: Secondary | ICD-10-CM | POA: Diagnosis present

## 2015-06-23 DIAGNOSIS — Z3490 Encounter for supervision of normal pregnancy, unspecified, unspecified trimester: Secondary | ICD-10-CM

## 2015-06-23 DIAGNOSIS — O09523 Supervision of elderly multigravida, third trimester: Secondary | ICD-10-CM

## 2015-06-23 DIAGNOSIS — O9962 Diseases of the digestive system complicating childbirth: Secondary | ICD-10-CM | POA: Diagnosis present

## 2015-06-23 LAB — RPR: RPR: NONREACTIVE

## 2015-06-23 LAB — CBC
HCT: 35 % — ABNORMAL LOW (ref 36.0–46.0)
Hemoglobin: 12.2 g/dL (ref 12.0–15.0)
MCH: 31.2 pg (ref 26.0–34.0)
MCHC: 34.9 g/dL (ref 30.0–36.0)
MCV: 89.5 fL (ref 78.0–100.0)
Platelets: 194 10*3/uL (ref 150–400)
RBC: 3.91 MIL/uL (ref 3.87–5.11)
RDW: 13.2 % (ref 11.5–15.5)
WBC: 6.6 10*3/uL (ref 4.0–10.5)

## 2015-06-23 LAB — ABO/RH: ABO/RH(D): A POS

## 2015-06-23 LAB — TYPE AND SCREEN
ABO/RH(D): A POS
Antibody Screen: NEGATIVE

## 2015-06-23 MED ORDER — ONDANSETRON HCL 4 MG PO TABS: 4.0000 mg | ORAL_TABLET | ORAL | Status: DC | PRN

## 2015-06-23 MED ORDER — LACTATED RINGERS IV SOLN
500.0000 mL | INTRAVENOUS | Status: DC | PRN
  Administered 2015-06-23: 1000 mL via INTRAVENOUS

## 2015-06-23 MED ORDER — DIPHENHYDRAMINE HCL 50 MG/ML IJ SOLN: 12.5000 mg | INTRAMUSCULAR | Status: DC | PRN

## 2015-06-23 MED ORDER — LIDOCAINE HCL (PF) 1 % IJ SOLN
INTRAMUSCULAR | Status: DC | PRN
  Administered 2015-06-23 (×2): 8 mL via EPIDURAL

## 2015-06-23 MED ORDER — OXYCODONE-ACETAMINOPHEN 5-325 MG PO TABS: 2.0000 | ORAL_TABLET | ORAL | Status: DC | PRN

## 2015-06-23 MED ORDER — SIMETHICONE 80 MG PO CHEW: 80.0000 mg | CHEWABLE_TABLET | ORAL | Status: DC | PRN

## 2015-06-23 MED ORDER — LACTATED RINGERS IV SOLN
INTRAVENOUS | Status: DC
  Administered 2015-06-23: 12:00:00 via INTRAVENOUS

## 2015-06-23 MED ORDER — ACETAMINOPHEN 325 MG PO TABS: 650.0000 mg | ORAL_TABLET | ORAL | Status: DC | PRN

## 2015-06-23 MED ORDER — PRENATAL MULTIVITAMIN CH
1.0000 | ORAL_TABLET | Freq: Every day | ORAL | Status: DC
  Administered 2015-06-24: 1 via ORAL
  Filled 2015-06-23: qty 1

## 2015-06-23 MED ORDER — EPHEDRINE 5 MG/ML INJ: 10.0000 mg | INTRAVENOUS | Status: DC | PRN

## 2015-06-23 MED ORDER — OXYCODONE-ACETAMINOPHEN 5-325 MG PO TABS: 1.0000 | ORAL_TABLET | ORAL | Status: DC | PRN

## 2015-06-23 MED ORDER — TERBUTALINE SULFATE 1 MG/ML IJ SOLN: 0.2500 mg | Freq: Once | INTRAMUSCULAR | Status: DC | PRN

## 2015-06-23 MED ORDER — CITRIC ACID-SODIUM CITRATE 334-500 MG/5ML PO SOLN: 30.0000 mL | ORAL | Status: DC | PRN

## 2015-06-23 MED ORDER — OXYTOCIN 40 UNITS IN LACTATED RINGERS INFUSION - SIMPLE MED: 1.0000 m[IU]/min | INTRAVENOUS | Status: DC

## 2015-06-23 MED ORDER — ZOLPIDEM TARTRATE 5 MG PO TABS: 5.0000 mg | ORAL_TABLET | Freq: Every evening | ORAL | Status: DC | PRN

## 2015-06-23 MED ORDER — SENNOSIDES-DOCUSATE SODIUM 8.6-50 MG PO TABS
2.0000 | ORAL_TABLET | ORAL | Status: DC
  Administered 2015-06-23: 2 via ORAL
  Filled 2015-06-23: qty 2

## 2015-06-23 MED ORDER — FENTANYL 2.5 MCG/ML BUPIVACAINE 1/10 % EPIDURAL INFUSION (WH - ANES)
14.0000 mL/h | INTRAMUSCULAR | Status: DC | PRN
  Administered 2015-06-23: 14 mL/h via EPIDURAL
  Filled 2015-06-23: qty 125

## 2015-06-23 MED ORDER — DIBUCAINE 1 % RE OINT
1.0000 "application " | TOPICAL_OINTMENT | RECTAL | Status: DC | PRN
  Filled 2015-06-23: qty 28

## 2015-06-23 MED ORDER — OXYTOCIN 40 UNITS IN LACTATED RINGERS INFUSION - SIMPLE MED
62.5000 mL/h | INTRAVENOUS | Status: DC
  Administered 2015-06-23: 999 mL/h via INTRAVENOUS
  Filled 2015-06-23: qty 1000

## 2015-06-23 MED ORDER — ONDANSETRON HCL 4 MG/2ML IJ SOLN: 4.0000 mg | Freq: Four times a day (QID) | INTRAMUSCULAR | Status: DC | PRN

## 2015-06-23 MED ORDER — IBUPROFEN 600 MG PO TABS
600.0000 mg | ORAL_TABLET | Freq: Four times a day (QID) | ORAL | Status: DC
  Administered 2015-06-23 – 2015-06-24 (×3): 600 mg via ORAL
  Filled 2015-06-23 (×4): qty 1

## 2015-06-23 MED ORDER — WITCH HAZEL-GLYCERIN EX PADS: 1.0000 "application " | MEDICATED_PAD | CUTANEOUS | Status: DC | PRN

## 2015-06-23 MED ORDER — LANOLIN HYDROUS EX OINT: TOPICAL_OINTMENT | CUTANEOUS | Status: DC | PRN

## 2015-06-23 MED ORDER — LIDOCAINE HCL (PF) 1 % IJ SOLN: 30.0000 mL | INTRAMUSCULAR | Status: DC | PRN

## 2015-06-23 MED ORDER — PHENYLEPHRINE 40 MCG/ML (10ML) SYRINGE FOR IV PUSH (FOR BLOOD PRESSURE SUPPORT)
80.0000 ug | PREFILLED_SYRINGE | INTRAVENOUS | Status: DC | PRN
  Filled 2015-06-23: qty 20

## 2015-06-23 MED ORDER — OXYTOCIN BOLUS FROM INFUSION: 500.0000 mL | INTRAVENOUS | Status: DC

## 2015-06-23 MED ORDER — OXYTOCIN 40 UNITS IN LACTATED RINGERS INFUSION - SIMPLE MED
1.0000 m[IU]/min | INTRAVENOUS | Status: DC
  Administered 2015-06-23: 2 m[IU]/min via INTRAVENOUS

## 2015-06-23 MED ORDER — FLEET ENEMA 7-19 GM/118ML RE ENEM: 1.0000 | ENEMA | RECTAL | Status: DC | PRN

## 2015-06-23 MED ORDER — DIPHENHYDRAMINE HCL 25 MG PO CAPS: 25.0000 mg | ORAL_CAPSULE | Freq: Four times a day (QID) | ORAL | Status: DC | PRN

## 2015-06-23 MED ORDER — ONDANSETRON HCL 4 MG/2ML IJ SOLN: 4.0000 mg | INTRAMUSCULAR | Status: DC | PRN

## 2015-06-23 MED ORDER — ACETAMINOPHEN 325 MG PO TABS
650.0000 mg | ORAL_TABLET | ORAL | Status: DC | PRN
Start: 2015-06-23 — End: 2015-06-23

## 2015-06-23 MED ORDER — BENZOCAINE-MENTHOL 20-0.5 % EX AERO
1.0000 | INHALATION_SPRAY | CUTANEOUS | Status: DC | PRN
Start: 2015-06-23 — End: 2015-06-24
  Filled 2015-06-23: qty 56

## 2015-06-23 MED ORDER — TETANUS-DIPHTH-ACELL PERTUSSIS 5-2.5-18.5 LF-MCG/0.5 IM SUSP
0.5000 mL | Freq: Once | INTRAMUSCULAR | Status: DC
  Filled 2015-06-23: qty 0.5

## 2015-06-23 NOTE — Anesthesia Preprocedure Evaluation (Signed)
Anesthesia Evaluation  Patient identified by MRN, date of birth, ID band Patient awake    Reviewed: Allergy & Precautions, H&P , NPO status , Patient's Chart, lab work & pertinent test results  Airway Mallampati: I  TM Distance: >3 FB Neck ROM: full    Dental no notable dental hx.    Pulmonary neg pulmonary ROS,    Pulmonary exam normal        Cardiovascular negative cardio ROS Normal cardiovascular exam     Neuro/Psych negative neurological ROS  negative psych ROS   GI/Hepatic Neg liver ROS,   Endo/Other  negative endocrine ROS  Renal/GU negative Renal ROS     Musculoskeletal   Abdominal Normal abdominal exam  (+)   Peds  Hematology negative hematology ROS (+)   Anesthesia Other Findings   Reproductive/Obstetrics (+) Pregnancy                             Anesthesia Physical Anesthesia Plan  ASA: II  Anesthesia Plan: Epidural   Post-op Pain Management:    Induction:   Airway Management Planned:   Additional Equipment:   Intra-op Plan:   Post-operative Plan:   Informed Consent: I have reviewed the patients History and Physical, chart, labs and discussed the procedure including the risks, benefits and alternatives for the proposed anesthesia with the patient or authorized representative who has indicated his/her understanding and acceptance.     Plan Discussed with:   Anesthesia Plan Comments:         Anesthesia Quick Evaluation  

## 2015-06-23 NOTE — H&P (Signed)
Christine Fischer is a 39 y.o. female presenting for elective IOL.  Antepartum course complicated by AMA; normal testing.  GBS negative.  Maternal Medical History:  Contractions: Onset was less than 1 hour ago.   Frequency: rare.   Perceived severity is mild.    Fetal activity: Perceived fetal activity is normal.   Last perceived fetal movement was within the past 24 hours.    Prenatal complications: no prenatal complications Prenatal Complications - Diabetes: none.    OB History    Gravida Para Term Preterm AB TAB SAB Ectopic Multiple Living   4 2 2  1 1    2      Past Medical History  Diagnosis Date  . GERD (gastroesophageal reflux disease)   . Hx of varicella   . Hx of pyelonephritis    Past Surgical History  Procedure Laterality Date  . Dilation and curettage of uterus    . Mouth surgery    . Mole excision     Family History: family history includes Atrial fibrillation in her father and paternal grandmother; Birth defects in her daughter; Cancer in her father, maternal aunt, and mother; Cerebral palsy in her maternal aunt. Social History:  reports that she has never smoked. She has never used smokeless tobacco. She reports that she drinks alcohol. She reports that she does not use illicit drugs.   Prenatal Transfer Tool  Maternal Diabetes: No Genetic Screening: Normal Maternal Ultrasounds/Referrals: Normal Fetal Ultrasounds or other Referrals:  None Maternal Substance Abuse:  No Significant Maternal Medications:  None Significant Maternal Lab Results:  Lab values include: Group B Strep negative Other Comments:  None  ROS    Blood pressure 128/82, pulse 79, temperature 98 F (36.7 C), temperature source Oral, resp. rate 16, height 5\' 8"  (1.727 m), weight 88.905 kg (196 lb), currently breastfeeding. Maternal Exam:  Uterine Assessment: Contraction strength is mild.  Contraction frequency is rare.   Abdomen: Fundal height is c/w dates.   Estimated fetal weight  is 8#.       Physical Exam  Constitutional: She is oriented to person, place, and time. She appears well-developed and well-nourished.  GI: Soft. There is no rebound and no guarding.  Neurological: She is alert and oriented to person, place, and time.  Skin: Skin is warm and dry.  Psychiatric: She has a normal mood and affect. Her behavior is normal.    Prenatal labs: ABO, Rh: A/Positive/-- (05/24 0000) Antibody: Negative (05/24 0000) Rubella: Immune (05/24 0000) RPR: Nonreactive (05/24 0000)  HBsAg: Negative (05/24 0000)  HIV: Non-reactive (05/24 0000)  GBS: Negative (12/07 0000)   Assessment/Plan: 39yo [id number] at 2937w1d for IOL -AROM when IV in -Epidural if desired -Anticipate NSVD   Christine Fischer 06/23/2015, 8:22 AM

## 2015-06-23 NOTE — Anesthesia Procedure Notes (Signed)
Epidural Patient location during procedure: OB Start time: 06/23/2015 12:16 PM End time: 06/23/2015 12:20 PM  Staffing Anesthesiologist: Leilani AbleHATCHETT, Lania Zawistowski Performed by: anesthesiologist   Preanesthetic Checklist Completed: patient identified, surgical consent, pre-op evaluation, timeout performed, IV checked, risks and benefits discussed and monitors and equipment checked  Epidural Patient position: sitting Prep: site prepped and draped and DuraPrep Patient monitoring: continuous pulse ox and blood pressure Approach: midline Location: L3-L4 Injection technique: LOR air  Needle:  Needle type: Tuohy  Needle gauge: 17 G Needle length: 9 cm and 9 Needle insertion depth: 5 cm cm Catheter type: closed end flexible Catheter size: 19 Gauge Catheter at skin depth: 10 cm Test dose: negative and Other  Assessment Sensory level: T9 Events: blood not aspirated, injection not painful, no injection resistance, negative IV test and no paresthesia  Additional Notes Reason for block:procedure for pain

## 2015-06-23 NOTE — Lactation Note (Signed)
This note was copied from the chart of Girl Mathis BudCameron Overbeck. Lactation Consultation Note Initial visit at 7 hours of age.  Mom reports good feeding now with minimal soreness.  Encouraged mom to work on hand expression pre and post feedings and rub EBM onto nipples for comfort.  Encouraged mom to pull baby close to breast with nose cheeks and chin touching breast.  Encouraged pillow support with feedings.  [facility] resources given and discussed.  Encouraged to feed with early cues on demand.  Early newborn behavior discussed.  Hand expression demonstrated by mom with colostrum visible. Several questions answered regarding introducing pumping and bottle feeding.   Mom to call for assist as needed.    Patient Name: Girl Mathis BudCameron Statler ZOXWR'UToday's Date: 06/23/2015 Reason for consult: Initial assessment   Maternal Data Has patient been taught Hand Expression?: Yes Does the patient have breastfeeding experience prior to this delivery?: Yes  Feeding Feeding Type: Breast Fed Length of feed: 10 min  LATCH Score/Interventions Latch: Grasps breast easily, tongue down, lips flanged, rhythmical sucking.  Audible Swallowing: A few with stimulation  Type of Nipple: Everted at rest and after stimulation  Comfort (Breast/Nipple): Soft / non-tender     Hold (Positioning): Assistance needed to correctly position infant at breast and maintain latch. Intervention(s): Breastfeeding basics reviewed;Support Pillows;Position options;Skin to skin  LATCH Score: 8  Lactation Tools Discussed/Used     Consult Status Consult Status: Follow-up Date: 06/24/15 Follow-up type: In-patient    Jannifer RodneyShoptaw, Jana Lynn 06/23/2015, 8:52 PM

## 2015-06-24 LAB — CBC
HEMATOCRIT: 31.2 % — AB (ref 36.0–46.0)
HEMOGLOBIN: 10.5 g/dL — AB (ref 12.0–15.0)
MCH: 30.5 pg (ref 26.0–34.0)
MCHC: 33.7 g/dL (ref 30.0–36.0)
MCV: 90.7 fL (ref 78.0–100.0)
Platelets: 173 10*3/uL (ref 150–400)
RBC: 3.44 MIL/uL — ABNORMAL LOW (ref 3.87–5.11)
RDW: 13.5 % (ref 11.5–15.5)
WBC: 10.7 10*3/uL — ABNORMAL HIGH (ref 4.0–10.5)

## 2015-06-24 MED ORDER — IBUPROFEN 600 MG PO TABS: 600.0000 mg | ORAL_TABLET | Freq: Four times a day (QID) | ORAL | Status: DC

## 2015-06-24 MED ORDER — OXYCODONE-ACETAMINOPHEN 5-325 MG PO TABS
1.0000 | ORAL_TABLET | ORAL | Status: DC | PRN
Start: 2015-06-24 — End: 2019-08-03

## 2015-06-24 NOTE — Lactation Note (Signed)
This note was copied from the chart of Christine Mathis BudCameron Schiff. Lactation Consultation Note: Experienced BF mom reports this baby is nursing great- the best of all her babies. No pain with latch. No questions at present. Reviewed OP appointments and [facility] as resource if needed after DC. To call prn  Patient Name: Christine Fischer YNWGN'FToday's Date: 06/24/2015 Reason for consult: Follow-up assessment   Maternal Data Formula Feeding for Exclusion: No Does the patient have breastfeeding experience prior to this delivery?: Yes  Feeding   LATCH Score/Interventions                      Lactation Tools Discussed/Used     Consult Status Consult Status: Complete    Pamelia HoitWeeks, Derinda Bartus D 06/24/2015, 2:30 PM

## 2015-06-24 NOTE — Anesthesia Postprocedure Evaluation (Signed)
Anesthesia Post Note  Patient: Christine Fischer  Procedure(s) Performed: * No procedures listed *  Patient location during evaluation: Mother Baby Anesthesia Type: Epidural Level of consciousness: awake Pain management: satisfactory to patient Vital Signs Assessment: post-procedure vital signs reviewed and stable Respiratory status: spontaneous breathing Cardiovascular status: stable Anesthetic complications: no    Last Vitals:  Filed Vitals:   06/23/15 2216 06/24/15 0555  BP: 123/74 117/80  Pulse: 74 67  Temp: 36.7 C 36.7 C  Resp: 18 18    Last Pain:  Filed Vitals:   06/24/15 0637  PainSc: 0-No pain                 Marwah Disbro

## 2015-06-24 NOTE — Progress Notes (Signed)
Post Partum Day 1 Subjective: no complaints, up ad lib, voiding and tolerating PO.  Requests early discharge.  Objective: Blood pressure 117/80, pulse 67, temperature 98 F (36.7 C), temperature source Oral, resp. rate 18, height 5\' 8"  (1.727 m), weight 88.905 kg (196 lb), SpO2 99 %, unknown if currently breastfeeding.  Physical Exam:  General: alert, cooperative and appears stated age Lochia: appropriate Uterine Fundus: firm Incision: healing well, no significant drainage, no dehiscence DVT Evaluation: No evidence of DVT seen on physical exam. Negative Homan's sign. No cords or calf tenderness.   Recent Labs  06/23/15 0810 06/24/15 0515  HGB 12.2 10.5*  HCT 35.0* 31.2*    Assessment/Plan: Discharge home and Breastfeeding   LOS: 1 day   Bionca Mckey 06/24/2015, 10:01 AM

## 2015-06-24 NOTE — Discharge Instructions (Signed)
Call MD for T>100.4, heavy vaginal bleeding, severe abdominal pain, or respiratory distress.  Call office to schedule postpartum visit in 6 weeks.  No driving while taking narcotics.  Pelvic rest x 6 weeks.  °

## 2015-06-24 NOTE — Addendum Note (Signed)
Addendum  created 06/24/15 [id number] by Algis GreenhouseLinda A Rilda Bulls, CRNA   Modules edited: Charges VN, Clinical Notes   Clinical Notes:  File: [id number]

## 2015-06-24 NOTE — Discharge Summary (Signed)
Obstetric Discharge Summary Reason for Admission: induction of labor Prenatal Procedures: none Intrapartum Procedures: spontaneous vaginal delivery Postpartum Procedures: none Complications-Operative and Postpartum: 1st degree perineal laceration HEMOGLOBIN  Date Value Ref Range Status  06/24/2015 10.5* 12.0 - 15.0 g/dL Final   HCT  Date Value Ref Range Status  06/24/2015 31.2* 36.0 - 46.0 % Final    Physical Exam:  General: alert, cooperative and appears stated age Lochia: appropriate Uterine Fundus: firm Incision: healing well, no significant drainage, no dehiscence DVT Evaluation: No evidence of DVT seen on physical exam. Negative Homan's sign. No cords or calf tenderness.  Discharge Diagnoses: Term Pregnancy-delivered  Discharge Information: Date: 06/24/2015 Activity: pelvic rest Diet: routine Medications: PNV, Ibuprofen and Percocet Condition: stable Instructions: refer to practice specific booklet Discharge to: home   Newborn Data: Live born female  Birth Weight: 7 lb 6.9 oz (3370 g) APGAR: 7, 9  Home with mother.  Sho Salguero 06/24/2015, 10:00 AM

## 2016-04-16 IMAGING — MG MM DIAGNOSTIC UNILATERAL L
4 series · 4 of 4 positions shown · non-contrast
Comparison: Prior outside screening mammogram dated 03/22/2014.

CLINICAL DATA: Screening recall for left breast asymmetry.

EXAM:
DIGITAL DIAGNOSTIC  LEFT MAMMOGRAM WITH CAD

[L CC (1 of 2)]
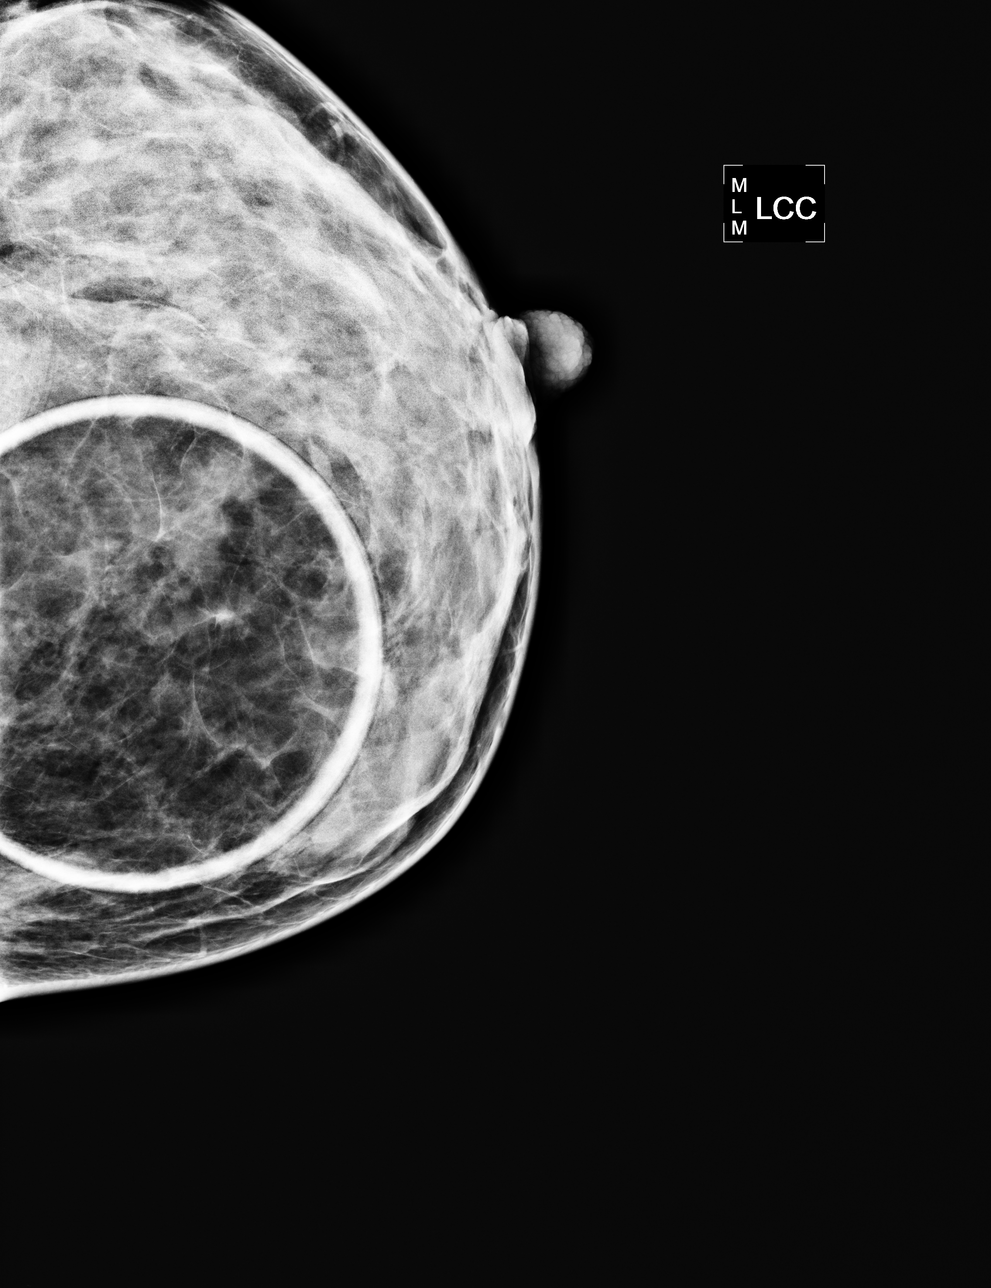

[L MLO]
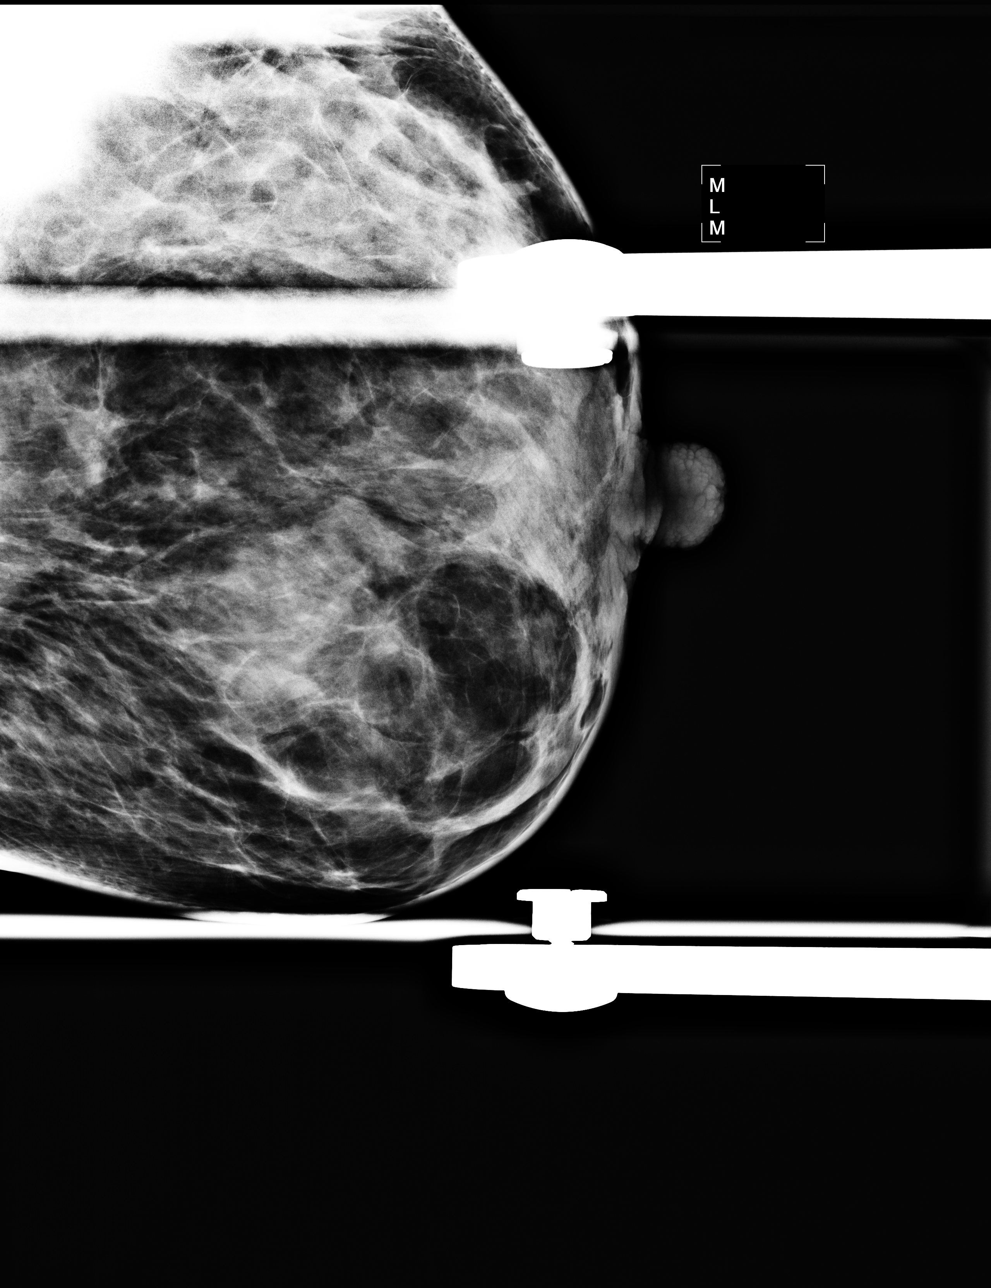

[L CC (2 of 2)]
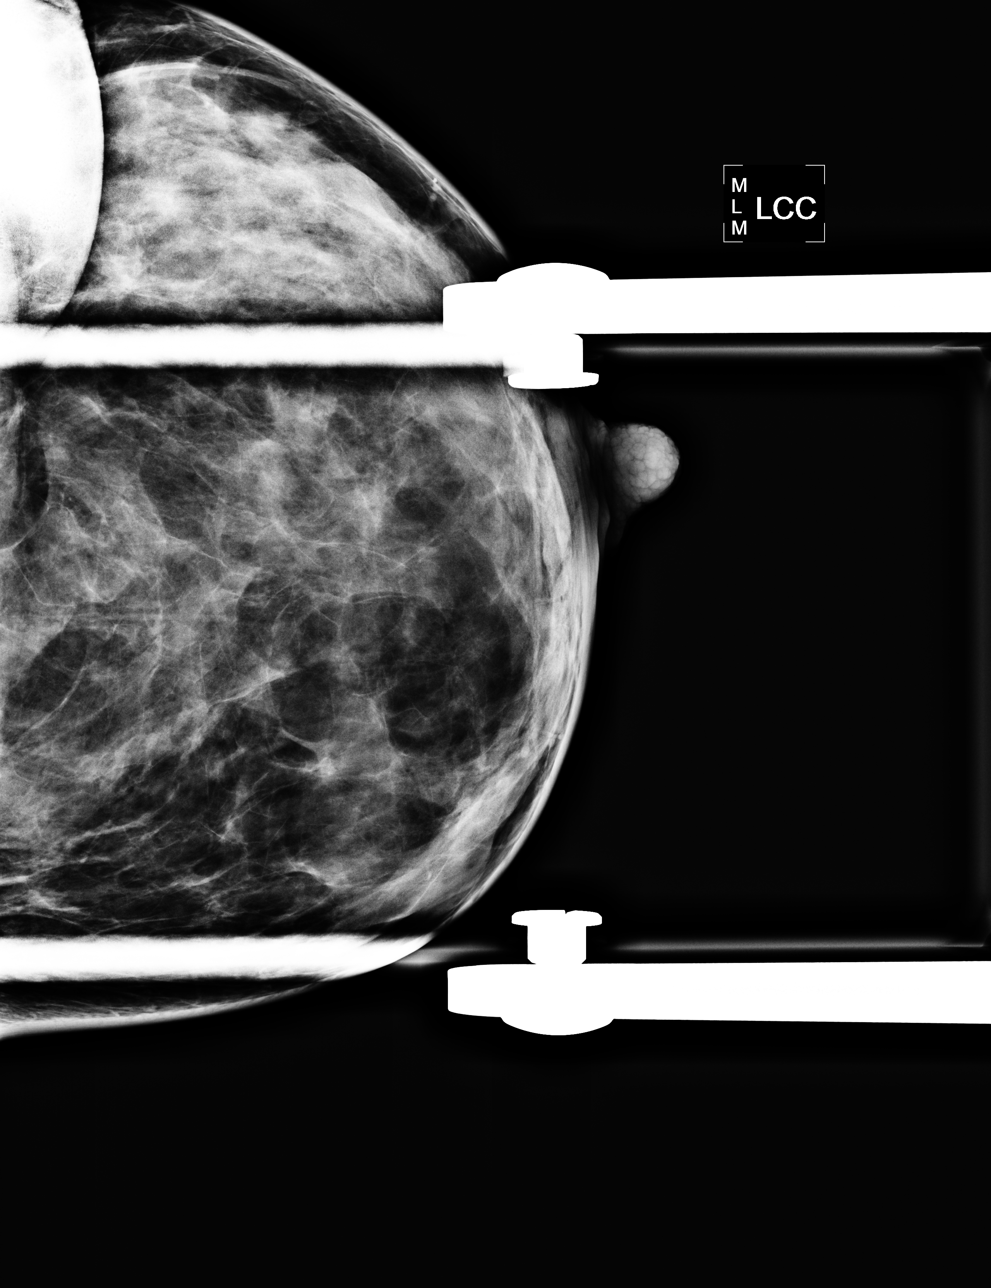

[L ML]
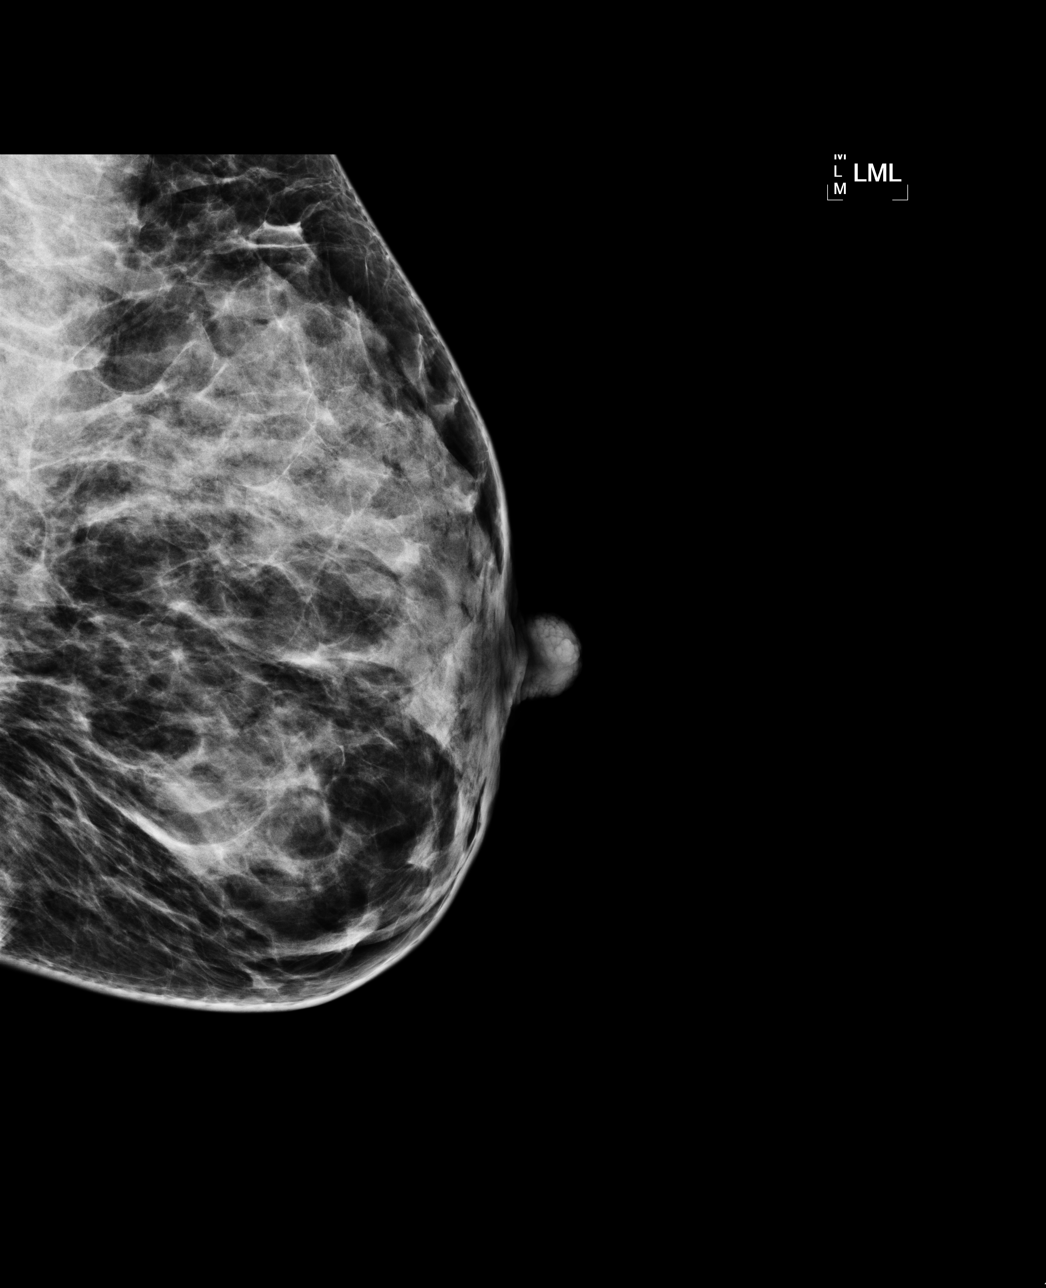

[4 of 4 positions shown; findings below may reference images not displayed]

ACR Breast Density Category c: The breast tissue is heterogeneously
dense, which may obscure small masses.
FINDINGS: The initially questioned left breast asymmetry resolves on the spot
compression CC and MLO views and true lateral view consistent with
overlapping tissue. There is no mammographic evidence of malignancy
in the left breast.

Mammographic images were processed with CAD.
IMPRESSION: Initially questioned left breast asymmetry resolves on the
additional imaging compatible with overlapping breast tissue. There
is no mammographic evidence of malignancy in the left breast.

RECOMMENDATION:
Screening mammogram at age 40 unless there are persistent or
intervening clinical concerns. (Code:H5-L-EXR)

I have discussed the findings and recommendations with the patient.
Results were also provided in writing at the conclusion of the
visit. If applicable, a reminder letter will be sent to the patient
regarding the next appointment.

BI-RADS CATEGORY  1: Negative.

## 2017-09-17 ENCOUNTER — Other Ambulatory Visit: Payer: Self-pay | Admitting: Obstetrics and Gynecology

## 2017-09-17 DIAGNOSIS — Z0000 Encounter for general adult medical examination without abnormal findings: Secondary | ICD-10-CM

## 2017-09-17 DIAGNOSIS — Z1321 Encounter for screening for nutritional disorder: Secondary | ICD-10-CM

## 2017-09-17 DIAGNOSIS — Z13220 Encounter for screening for lipoid disorders: Secondary | ICD-10-CM

## 2017-09-17 DIAGNOSIS — Z1329 Encounter for screening for other suspected endocrine disorder: Secondary | ICD-10-CM

## 2017-09-30 ENCOUNTER — Other Ambulatory Visit: Payer: 59 | Admitting: Internal Medicine

## 2017-09-30 DIAGNOSIS — Z13220 Encounter for screening for lipoid disorders: Secondary | ICD-10-CM

## 2017-09-30 DIAGNOSIS — Z1321 Encounter for screening for nutritional disorder: Secondary | ICD-10-CM

## 2017-09-30 DIAGNOSIS — Z1329 Encounter for screening for other suspected endocrine disorder: Secondary | ICD-10-CM

## 2017-09-30 DIAGNOSIS — Z0000 Encounter for general adult medical examination without abnormal findings: Secondary | ICD-10-CM

## 2017-10-01 LAB — CBC WITH DIFFERENTIAL/PLATELET
BASOS ABS: 72 {cells}/uL (ref 0–200)
Basophils Relative: 1.6 %
EOS ABS: 171 {cells}/uL (ref 15–500)
Eosinophils Relative: 3.8 %
HEMATOCRIT: 38.3 % (ref 35.0–45.0)
HEMOGLOBIN: 13 g/dL (ref 11.7–15.5)
LYMPHS ABS: 1836 {cells}/uL (ref 850–3900)
MCH: 30.1 pg (ref 27.0–33.0)
MCHC: 33.9 g/dL (ref 32.0–36.0)
MCV: 88.7 fL (ref 80.0–100.0)
MONOS PCT: 9.2 %
MPV: 10 fL (ref 7.5–12.5)
NEUTROS ABS: 2007 {cells}/uL (ref 1500–7800)
Neutrophils Relative %: 44.6 %
Platelets: 280 10*3/uL (ref 140–400)
RBC: 4.32 10*6/uL (ref 3.80–5.10)
RDW: 11.8 % (ref 11.0–15.0)
Total Lymphocyte: 40.8 %
WBC: 4.5 10*3/uL (ref 3.8–10.8)
WBCMIX: 414 {cells}/uL (ref 200–950)

## 2017-10-01 LAB — TSH: TSH: 2.75 mIU/L

## 2017-10-01 LAB — COMPLETE METABOLIC PANEL WITH GFR
AG RATIO: 2.2 (calc) (ref 1.0–2.5)
ALT: 11 U/L (ref 6–29)
AST: 19 U/L (ref 10–30)
Albumin: 4.7 g/dL (ref 3.6–5.1)
Alkaline phosphatase (APISO): 45 U/L (ref 33–115)
BUN: 11 mg/dL (ref 7–25)
CALCIUM: 9.3 mg/dL (ref 8.6–10.2)
CO2: 28 mmol/L (ref 20–32)
CREATININE: 0.91 mg/dL (ref 0.50–1.10)
Chloride: 104 mmol/L (ref 98–110)
GFR, EST AFRICAN AMERICAN: 92 mL/min/{1.73_m2} (ref >=60)
GFR, EST NON AFRICAN AMERICAN: 79 mL/min/{1.73_m2} (ref >=60)
Globulin: 2.1 g/dL (calc) (ref 1.9–3.7)
Glucose, Bld: 88 mg/dL (ref 65–99)
POTASSIUM: 4.2 mmol/L (ref 3.5–5.3)
Sodium: 139 mmol/L (ref 135–146)
TOTAL PROTEIN: 6.8 g/dL (ref 6.1–8.1)
Total Bilirubin: 0.9 mg/dL (ref 0.2–1.2)

## 2017-10-01 LAB — LIPID PANEL
CHOLESTEROL: 164 mg/dL (ref <200)
HDL: 82 mg/dL (ref >50)
LDL Cholesterol (Calc): 68 mg/dL (calc)
NON-HDL CHOLESTEROL (CALC): 82 mg/dL (ref <130)
Total CHOL/HDL Ratio: 2 (calc) (ref <5.0)
Triglycerides: 63 mg/dL (ref <150)

## 2017-10-01 LAB — VITAMIN D 25 HYDROXY (VIT D DEFICIENCY, FRACTURES): Vit D, 25-Hydroxy: 47 ng/mL (ref 30–100)

## 2017-10-02 ENCOUNTER — Encounter: Payer: Self-pay | Admitting: Internal Medicine

## 2017-10-02 ENCOUNTER — Ambulatory Visit (INDEPENDENT_AMBULATORY_CARE_PROVIDER_SITE_OTHER): Payer: 59 | Admitting: Internal Medicine

## 2017-10-02 VITALS — BP 120/90 | HR 78 | Ht 67.5 in | Wt 163.0 lb

## 2017-10-02 DIAGNOSIS — Z0000 Encounter for general adult medical examination without abnormal findings: Secondary | ICD-10-CM

## 2017-10-02 DIAGNOSIS — G43109 Migraine with aura, not intractable, without status migrainosus: Secondary | ICD-10-CM

## 2017-10-02 DIAGNOSIS — R002 Palpitations: Secondary | ICD-10-CM | POA: Diagnosis not present

## 2017-10-02 DIAGNOSIS — M542 Cervicalgia: Secondary | ICD-10-CM

## 2017-10-02 DIAGNOSIS — R51 Headache: Secondary | ICD-10-CM

## 2017-10-02 DIAGNOSIS — R519 Headache, unspecified: Secondary | ICD-10-CM

## 2017-10-02 LAB — POCT URINALYSIS DIPSTICK
Appearance: NORMAL
Bilirubin, UA: NEGATIVE
GLUCOSE UA: NEGATIVE
Ketones, UA: NEGATIVE
Leukocytes, UA: NEGATIVE
NITRITE UA: NEGATIVE
Odor: NORMAL
Protein, UA: NEGATIVE
RBC UA: NEGATIVE
SPEC GRAV UA: 1.01 (ref 1.010–1.025)
Urobilinogen, UA: 0.2 E.U./dL
pH, UA: 7.5 (ref 5.0–8.0)

## 2017-10-02 MED ORDER — CYCLOBENZAPRINE HCL 10 MG PO TABS: ORAL_TABLET | ORAL | 0 refills | Status: DC

## 2017-10-02 NOTE — Progress Notes (Signed)
Subjective:    Patient ID: Christine Fischer, female    DOB: [birth year], 42 y.o.   MRN: [id number]  HPI  42 year old Female for exam and evaluation of medical issues.  Last physical exam here was 2011. She has 3 children. Has NuvaRing for birth control.  Recently has developed some palpitations.  She drinks 2 cups of coffee with caffeine in the mornings.  These palpitations have occurred in the evenings.  Does not think she is taking over-the-counter decongestants but does take an allergy preparation at night.  Usually takes his before bedtime.  She will check and see if it has a decongestant in it.  If so she should discontinue the decongestant.  She continues to work full-time for [employer] and is a full-time mother is well.  Seems to be handling stress okay but recently has begun to have some headaches.  Has some issues with neck pain and occipital type headaches.  Also recently has had some issues with tunnel vision in left eye which sounds like an ophthalmic migraine.  She needs to have a good eye exam in the near future.  He cannot tell me exactly how often these events are occurring.  Social history: She is married.  3 children daughters ages 32 and 85 and a 79-year-old son.  Works full-time for [employer].  Social alcohol consumption consisting of bourbon.  Does not smoke.  Family history: Father with history of prostate cancer and atrial fibrillation.  Mother died of complications of leukemia status post bone marrow transplant.  She was 40 years old.    Patient completed 4 years of college.  Enjoys working out and Forensic psychologist.  EKG obtained today is entirely within normal limits   Review of Systems see above.  Denies shortness of breath or chest pain.     Objective:   Physical Exam  Constitutional: She is oriented to person, place, and time. She appears well-developed and well-nourished. No distress.  HENT:  Head: Normocephalic and atraumatic.  Right Ear: External ear normal.    Left Ear: External ear normal.  Mouth/Throat: Oropharynx is clear and moist.  Eyes: Pupils are equal, round, and reactive to light. Conjunctivae and EOM are normal. Right eye exhibits no discharge. Left eye exhibits no discharge. No scleral icterus.  Neck: Neck supple. No JVD present. No thyromegaly present.  Cardiovascular: Normal rate, regular rhythm and normal heart sounds.  No murmur heard. Pulmonary/Chest: Effort normal and breath sounds normal. No respiratory distress. She has no wheezes. She has no rales. She exhibits no tenderness.  Abdominal: Soft. Bowel sounds are normal. She exhibits no distension and no mass. There is no tenderness. There is no rebound and no guarding.  Genitourinary:  Genitourinary Comments: Deferred to GYN  Musculoskeletal: She exhibits no edema.  Lymphadenopathy:    She has no cervical adenopathy.  Neurological: She is alert and oriented to person, place, and time. She has normal reflexes. No cranial nerve deficit.  Skin: Skin is warm and dry. No rash noted. She is not diaphoretic.  Psychiatric: She has a normal mood and affect. Her behavior is normal. Judgment and thought content normal.  Vitals reviewed.         Assessment & Plan:  Normal health maintenance exam  Palpitations-watch and see if she is taking decongestant with allergy preparation.  If so discontinue decongestant.  Keep track of events of palpitations and how long they last.  May need to cut back on caffeine altogether.  Regarding headaches, given food list that might possibly cause headaches.  May take ibuprofen for occipital type headaches.  Keep headache diary over the next couple of weeks.  Return in 2 weeks.  May take Flexeril 1/2 tablet at onset of occipital headache.  Ice neck down.

## 2017-10-02 NOTE — Patient Instructions (Addendum)
Take ibuprofen for headache and neck area.  Try to ice neck down for 20 minutes daily to help with soreness.  Try muscle relaxant-Flexeril- at onset of occipital headache.  Keep track of ophthalmic type migraine headaches with diarrhea.  Follow-up in 2-4 weeks here.  Keep record of palpitations.  Make sure you are not taking decongestants.

## 2017-10-20 ENCOUNTER — Telehealth: Payer: Self-pay

## 2017-10-20 NOTE — Telephone Encounter (Signed)
Patient called to cancel her appt for tomorrow at 9:45am. She said she hasn't had a headache since the last time she was here.

## 2017-10-21 ENCOUNTER — Ambulatory Visit: Payer: 59 | Admitting: Internal Medicine

## 2018-10-09 ENCOUNTER — Telehealth: Payer: Self-pay | Admitting: Internal Medicine

## 2018-10-09 NOTE — Telephone Encounter (Signed)
LVM to CB and schedule CPE & Labs due 10/04/18

## 2019-04-21 ENCOUNTER — Encounter: Payer: Self-pay | Admitting: Internal Medicine

## 2019-04-21 NOTE — Telephone Encounter (Signed)
LVM to CB and schedule CPE and Fasting labs. Mailed letter to call office and schedule CPE and fasting lab

## 2019-04-21 NOTE — Telephone Encounter (Signed)
Patient called back and scheduled CPE and Labs

## 2019-08-02 ENCOUNTER — Other Ambulatory Visit: Payer: 59 | Admitting: Internal Medicine

## 2019-08-02 ENCOUNTER — Other Ambulatory Visit: Payer: Self-pay

## 2019-08-02 DIAGNOSIS — Z1329 Encounter for screening for other suspected endocrine disorder: Secondary | ICD-10-CM

## 2019-08-02 DIAGNOSIS — Z0000 Encounter for general adult medical examination without abnormal findings: Secondary | ICD-10-CM

## 2019-08-02 DIAGNOSIS — Z13220 Encounter for screening for lipoid disorders: Secondary | ICD-10-CM

## 2019-08-03 ENCOUNTER — Ambulatory Visit (INDEPENDENT_AMBULATORY_CARE_PROVIDER_SITE_OTHER): Payer: 59 | Admitting: Internal Medicine

## 2019-08-03 ENCOUNTER — Encounter: Payer: Self-pay | Admitting: Internal Medicine

## 2019-08-03 VITALS — BP 120/80 | HR 67 | Temp 98.0°F | Ht 68.0 in | Wt 164.0 lb

## 2019-08-03 DIAGNOSIS — R519 Headache, unspecified: Secondary | ICD-10-CM

## 2019-08-03 DIAGNOSIS — Z0000 Encounter for general adult medical examination without abnormal findings: Secondary | ICD-10-CM

## 2019-08-03 LAB — COMPLETE METABOLIC PANEL WITH GFR
AG Ratio: 2 (calc) (ref 1.0–2.5)
ALT: 11 U/L (ref 6–29)
AST: 16 U/L (ref 10–30)
Albumin: 4.5 g/dL (ref 3.6–5.1)
Alkaline phosphatase (APISO): 46 U/L (ref 31–125)
BUN: 8 mg/dL (ref 7–25)
CO2: 26 mmol/L (ref 20–32)
Calcium: 9.6 mg/dL (ref 8.6–10.2)
Chloride: 104 mmol/L (ref 98–110)
Creat: 0.79 mg/dL (ref 0.50–1.10)
GFR, Est African American: 108 mL/min/{1.73_m2} (ref >=60)
GFR, Est Non African American: 93 mL/min/{1.73_m2} (ref >=60)
Globulin: 2.2 g/dL (calc) (ref 1.9–3.7)
Glucose, Bld: 98 mg/dL (ref 65–99)
Potassium: 3.7 mmol/L (ref 3.5–5.3)
Sodium: 139 mmol/L (ref 135–146)
Total Bilirubin: 0.8 mg/dL (ref 0.2–1.2)
Total Protein: 6.7 g/dL (ref 6.1–8.1)

## 2019-08-03 LAB — CBC WITH DIFFERENTIAL/PLATELET
Absolute Monocytes: 469 cells/uL (ref 200–950)
Basophils Absolute: 40 cells/uL (ref 0–200)
Basophils Relative: 0.6 %
Eosinophils Absolute: 107 cells/uL (ref 15–500)
Eosinophils Relative: 1.6 %
HCT: 37.6 % (ref 35.0–45.0)
Hemoglobin: 12.8 g/dL (ref 11.7–15.5)
Lymphs Abs: 2003 cells/uL (ref 850–3900)
MCH: 30.6 pg (ref 27.0–33.0)
MCHC: 34 g/dL (ref 32.0–36.0)
MCV: 90 fL (ref 80.0–100.0)
MPV: 9.9 fL (ref 7.5–12.5)
Monocytes Relative: 7 %
Neutro Abs: 4080 cells/uL (ref 1500–7800)
Neutrophils Relative %: 60.9 %
Platelets: 285 10*3/uL (ref 140–400)
RBC: 4.18 10*6/uL (ref 3.80–5.10)
RDW: 11.7 % (ref 11.0–15.0)
Total Lymphocyte: 29.9 %
WBC: 6.7 10*3/uL (ref 3.8–10.8)

## 2019-08-03 LAB — POCT URINALYSIS DIPSTICK
Appearance: NEGATIVE
Bilirubin, UA: NEGATIVE
Blood, UA: NEGATIVE
Glucose, UA: NEGATIVE
Ketones, UA: NEGATIVE
Leukocytes, UA: NEGATIVE
Nitrite, UA: NEGATIVE
Odor: NEGATIVE
Protein, UA: NEGATIVE
Spec Grav, UA: 1.01 (ref 1.010–1.025)
Urobilinogen, UA: 0.2 E.U./dL
pH, UA: 6.5 (ref 5.0–8.0)

## 2019-08-03 LAB — LIPID PANEL
Cholesterol: 160 mg/dL (ref <200)
HDL: 83 mg/dL (ref >=50)
LDL Cholesterol (Calc): 60 mg/dL (calc)
Non-HDL Cholesterol (Calc): 77 mg/dL (calc) (ref <130)
Total CHOL/HDL Ratio: 1.9 (calc) (ref <5.0)
Triglycerides: 85 mg/dL (ref <150)

## 2019-08-03 LAB — TSH: TSH: 3.16 mIU/L

## 2019-08-03 MED ORDER — HYDROCODONE-ACETAMINOPHEN 10-325 MG PO TABS: ORAL_TABLET | ORAL | 0 refills | Status: DC

## 2019-08-03 MED ORDER — RIZATRIPTAN BENZOATE 10 MG PO TABS: 10.0000 mg | ORAL_TABLET | ORAL | 0 refills | Status: DC | PRN

## 2019-08-03 NOTE — Progress Notes (Signed)
   Subjective:    Patient ID: Christine Fischer, female    DOB: [birth year], 44 y.o.   MRN: [id number]  HPI 44 year old Female seen today for health maintenance exam and evaluation of medical issues.  She has been having some pain/headache over her left occipital bone.  This may be a migraine type of headache or could be occipital neuralgia.  Her general health has been excellent.  She does have history of issues with neck pain and occipital type headaches.  Social history: She works for [employer].  She has 2 daughters and a son.  Social alcohol consumption consisting of bourbon.  Does not smoke.  She is married.  Patient enjoys working out and Hydrologist.  She completed 4 years of college.  Family history: Father with history of prostate cancer and atrial fibrillation.  Mother died of complications of leukemia status post bone marrow transplant.  Mother was 71 years old at the time of her death.    Review of Systems  Constitutional: Negative.   Neurological:       Intermittent episodes of occipital headache  She had similar complaint in 2019     Objective:   Physical Exam Blood pressure 120/80 pulse 67 temperature 98 degrees orally pulse oximetry 98% weight 164 pounds BMI 24.94.  Skin warm and dry.  Nodes none.  TMs and pharynx are clear.  Neck is supple without thyromegaly or adenopathy.  Chest clear to auscultation.  Cardiac exam regular rate and rhythm normal S1 and S2 without murmurs or gallops.  Abdomen no hepatosplenomegaly masses or tenderness.  She has some mild tenderness over her right occipital area.  Neurological exam is intact without focal deficits.       Assessment & Plan:  Intermittent episodes of right occipital headache.  In 2019 we treated this with Flexeril.  Have prescribed Maxalt 10 mg at onset of migraine to see if this has a migrainous component.  Have prescribed hydrocodone/APAP 10/325 to take 1/2 to 1 tablet every 12 hours as needed severe headache.  Keep  a headache diary.  Labs reviewed and are within normal limits Including TSH lipid panel CBC and C met.  Vitamin D level not drawn due to expense

## 2019-08-03 NOTE — Patient Instructions (Signed)
It was a pleasure to see you today. Return in one year or as needed. Try Maxalt at onset of headache and if no relief within one hour, try one half tab of Norco 10/325 with some food for pain. Let me know if headaches are not well controlled.

## 2019-08-05 ENCOUNTER — Encounter: Payer: Self-pay | Admitting: Internal Medicine

## 2019-08-05 ENCOUNTER — Ambulatory Visit (INDEPENDENT_AMBULATORY_CARE_PROVIDER_SITE_OTHER): Payer: 59 | Admitting: Internal Medicine

## 2019-08-05 ENCOUNTER — Other Ambulatory Visit: Payer: Self-pay

## 2019-08-05 ENCOUNTER — Telehealth: Payer: Self-pay | Admitting: Internal Medicine

## 2019-08-05 VITALS — Temp 98.0°F | Ht 68.0 in | Wt 164.0 lb

## 2019-08-05 DIAGNOSIS — M549 Dorsalgia, unspecified: Secondary | ICD-10-CM | POA: Diagnosis not present

## 2019-08-05 DIAGNOSIS — R110 Nausea: Secondary | ICD-10-CM

## 2019-08-05 LAB — POCT URINALYSIS DIPSTICK
Appearance: NEGATIVE
Bilirubin, UA: NEGATIVE
Blood, UA: NEGATIVE
Glucose, UA: NEGATIVE
Ketones, UA: NEGATIVE
Leukocytes, UA: NEGATIVE
Nitrite, UA: NEGATIVE
Odor: NEGATIVE
Protein, UA: NEGATIVE
Spec Grav, UA: 1.01 (ref 1.010–1.025)
Urobilinogen, UA: 0.2 E.U./dL
pH, UA: 7 (ref 5.0–8.0)

## 2019-08-05 MED ORDER — ONDANSETRON HCL 4 MG PO TABS: 4.0000 mg | ORAL_TABLET | Freq: Three times a day (TID) | ORAL | 0 refills | Status: DC | PRN

## 2019-08-05 MED ORDER — CIPROFLOXACIN HCL 500 MG PO TABS: 500.0000 mg | ORAL_TABLET | Freq: Two times a day (BID) | ORAL | 0 refills | Status: DC

## 2019-08-05 NOTE — Progress Notes (Signed)
   Subjective:    Patient ID: Christine Fischer, female    DOB: 01-01-1976, 44 y.o.   MRN: [id number]  HPI 44 year old Female had onset today of nausea and left flank  pain without dysuria or frequency. She says this reminds her of UTIs that she has had in the remote past. Has not had a UTI in several years. Has not been treated here for UTI. No one else at home is ill. She sent clean catch urine specimen today and it is normal.  However, patient says with previous UTIs that her urine specimen was frequently normal. Has not traveled. Had diarrhea a couple of times this am. No known Covid exposure. No fever, chills,  dysguesia, or vomiting.Marland Kitchen  She was just here for physical examination on February 9 and was feeling fine then.  Due to the Coronavirus pandemic and her symptoms, she was seen today by interactive audio and video telecommunications.  She is identified using 2 identifiers as Christine Fischer, a patient in this practice.  She is agreeable to visit in this format today.   Review of Systems see above regarding symptoms     Objective:   Physical Exam Seen virtually at home resting.  She is in no acute distress and able to give a clear concise history.  Looks a little fatigued.  Dipstick UA brought in by husband is normal.     Assessment & Plan:  ?  Viral syndrome  Nausea  History of UTIs in the remote past but not recently that presented in similar manner  Rule out COVID-19 infection  Plan: Zofran 4 mg tablets 1 p.o. up to 3 times daily as needed for nausea #20 with no refill.  Rest and drink plenty of fluids.  Cipro 500 mg twice daily for 7 days.  Culture urine.  Call tomorrow with progress report.  Have COVID-19 test tomorrow at test center  Addendum: 19 test was negative.  Urine culture grew mixed flora.

## 2019-08-05 NOTE — Telephone Encounter (Signed)
Set up virtual visit 

## 2019-08-05 NOTE — Telephone Encounter (Signed)
Christine Fischer [phone]  Christine Fischer called to say today she is very nauseous, having very lose stool, going 3-4 times in an hour. No fever, no chills, she also said her left kidney aches and she has lower left back pain.

## 2019-08-05 NOTE — Telephone Encounter (Addendum)
Patients husband is going to come by and pick up cup for specimen and bring back. Schedule Virtual Visit

## 2019-08-06 ENCOUNTER — Ambulatory Visit: Payer: 59 | Attending: Internal Medicine

## 2019-08-06 DIAGNOSIS — Z20822 Contact with and (suspected) exposure to covid-19: Secondary | ICD-10-CM | POA: Insufficient documentation

## 2019-08-06 LAB — URINE CULTURE
MICRO NUMBER:: 10142465
SPECIMEN QUALITY:: ADEQUATE

## 2019-08-06 NOTE — Telephone Encounter (Signed)
Patient called back to say she is much better, she still has some nausea and now having upper back pain. She went and had PCR COVID test and she also went and had a rapid test that was negative.

## 2019-08-08 LAB — NOVEL CORONAVIRUS, NAA: SARS-CoV-2, NAA: NOT DETECTED

## 2019-08-22 NOTE — Patient Instructions (Addendum)
Zofran tablets 4 mg up to 3 times daily as needed for nausea.  Rest and drink plenty of fluids.  Urine dipstick normal but patient treated with Cipro pending urine culture 500 mg twice daily for 7 days.

## 2019-08-28 ENCOUNTER — Other Ambulatory Visit: Payer: Self-pay | Admitting: Internal Medicine

## 2020-08-17 ENCOUNTER — Other Ambulatory Visit: Payer: Self-pay | Admitting: Internal Medicine

## 2020-11-13 ENCOUNTER — Encounter: Payer: Self-pay | Admitting: Internal Medicine

## 2020-11-13 ENCOUNTER — Other Ambulatory Visit: Payer: Self-pay

## 2020-11-13 ENCOUNTER — Telehealth: Payer: Self-pay | Admitting: Internal Medicine

## 2020-11-13 ENCOUNTER — Ambulatory Visit (INDEPENDENT_AMBULATORY_CARE_PROVIDER_SITE_OTHER): Payer: 59 | Admitting: Internal Medicine

## 2020-11-13 VITALS — BP 120/80 | HR 78 | Temp 98.2°F | Ht 68.0 in | Wt 159.0 lb

## 2020-11-13 DIAGNOSIS — Z23 Encounter for immunization: Secondary | ICD-10-CM | POA: Diagnosis not present

## 2020-11-13 DIAGNOSIS — S81811A Laceration without foreign body, right lower leg, initial encounter: Secondary | ICD-10-CM | POA: Diagnosis not present

## 2020-11-13 MED ORDER — DOXYCYCLINE HYCLATE 100 MG PO TABS: 100.0000 mg | ORAL_TABLET | Freq: Two times a day (BID) | ORAL | 0 refills | Status: DC

## 2020-11-13 MED ORDER — MUPIROCIN 2 % EX OINT: 1.0000 "application " | TOPICAL_OINTMENT | Freq: Two times a day (BID) | CUTANEOUS | 0 refills | Status: DC

## 2020-11-13 MED ORDER — FLUCONAZOLE 150 MG PO TABS: 150.0000 mg | ORAL_TABLET | Freq: Once | ORAL | 0 refills | Status: AC

## 2020-11-13 NOTE — Telephone Encounter (Signed)
430 pm today

## 2020-11-13 NOTE — Telephone Encounter (Signed)
Christine Fischer [phone]  [name] called to say she has cut on her leg that she thinks might be getting infected. She cut it last week with dog leash. It is red and sore today. It is on the side of her leg between ankle and calf. She would like for you to look at it.

## 2020-11-13 NOTE — Telephone Encounter (Signed)
scheduled

## 2020-11-13 NOTE — Progress Notes (Signed)
   Subjective:    Patient ID: Christine Fischer, female    DOB: April 29, 1976, 45 y.o.   MRN: [id number]  HPI 45 year old Female seen in person for laceration to right leg 9 cm long . Retractable dog cord cut her leg accidentally when her active dog attached to the cord darted quickly and the retractable cord sliced her right leg.   Accident occurred last Tuesday May 17th. She has treated laceration topically did not seek medical attention until today.  Last tetanus immunization was August 2014.  She was given an update today.  Her general health is excellent.  Non-smoker.  Social alcohol consumption.  She is married with 3 children.  She has had 3 COVID-19 immunizations.     Review of Systems denies fever, chills, drainage from wound, significant pain from wound     Objective:   Physical Exam  BP 120/80, pulse 78, T 98.2 degrees Approximate 10 cm in length diagonally appearing laceration right anterior leg.  No evidence of secondary infection.     Assessment & Plan:  This laceration likely could have been repaired at time of occurrence.  It is not possible to do this now, so we will have to let it heal by secondary intention.  Doxycycline 100 mg twice daily for 7 days.  Apply Bactroban ointment twice daily to laceration after cleaning it with warm soapy water twice daily.  Call if signs of secondary bacterial infection develop or healing seems to be delayed.

## 2020-12-30 NOTE — Patient Instructions (Addendum)
Tetanus immunization update given.  Wash laceration with mild soap twice daily and pat dry.  Apply Bactroban ointment until healed.  Call if signs of secondary bacterial infection develop.  Take doxycycline 100 mg twice daily for 7 days.

## 2022-05-31 ENCOUNTER — Telehealth: Payer: Self-pay | Admitting: Internal Medicine

## 2022-05-31 ENCOUNTER — Encounter: Payer: Self-pay | Admitting: Internal Medicine

## 2022-05-31 ENCOUNTER — Ambulatory Visit (INDEPENDENT_AMBULATORY_CARE_PROVIDER_SITE_OTHER): Payer: 59 | Admitting: Internal Medicine

## 2022-05-31 VITALS — BP 128/84 | HR 68 | Temp 98.7°F | Ht 68.0 in | Wt 163.1 lb

## 2022-05-31 DIAGNOSIS — K6289 Other specified diseases of anus and rectum: Secondary | ICD-10-CM

## 2022-05-31 MED ORDER — HYDROCORTISONE 2.5 % EX CREA: TOPICAL_CREAM | Freq: Two times a day (BID) | CUTANEOUS | 0 refills | Status: DC

## 2022-05-31 NOTE — Progress Notes (Signed)
   Subjective:    Patient ID: Christine Fischer, female    DOB: 02/02/1976, 46 y.o.   MRN: [id number]  HPI Pleasant 46 year old Female here today for irritation in the rectal area.  This has been going on for a number of days and she is frustrated.  Her general health is excellent.  She exercises regularly.    Review of Systems see above: no bright red blood per rectum or blood on stool.  No constipation.     Objective:   Physical Exam  Vital signs reviewed.  Examination of the external rectum and perirectal area shows no evidence of thrombosed hemorrhoid or rectal bleeding.      Assessment & Plan:   Perirectal irritation-do not see thrombosed hemorrhoid or external hemorrhoids of concern.  Suspect there is just perirectal irritation.  Plan: Hydrocortisone cream 2.5% twice daily to rectal area and recommend 20-minute daily sitz baths for several days.  Time spent with history taking, examination and medical decision making is 20 minutes

## 2022-05-31 NOTE — Telephone Encounter (Signed)
Christine Fischer  [phone]  Calisa called she has had Hemorids for several weeks and has not been able to get them under control, I scheduled her to come in this afternoon.

## 2022-06-08 ENCOUNTER — Telehealth: Payer: 59 | Admitting: Nurse Practitioner

## 2022-06-08 DIAGNOSIS — J069 Acute upper respiratory infection, unspecified: Secondary | ICD-10-CM

## 2022-06-08 MED ORDER — PREDNISONE 20 MG PO TABS: 40.0000 mg | ORAL_TABLET | Freq: Every day | ORAL | 0 refills | Status: DC

## 2022-06-08 NOTE — Progress Notes (Signed)
We are sorry that you are not feeling well.  Here is how we plan to help!  Based on your presentation I believe you most likely have A cough due to a virus.  This is called viral bronchitis and is best treated by rest, plenty of fluids and control of the cough.  You may use Ibuprofen or Tylenol as directed to help your symptoms.     In addition you may use A non-prescription cough medication called Mucinex DM: take 2 tablets every 12 hours.  Prednisone 20mg  2 tablets at the same time daily for 5 ays  From your responses in the eVisit questionnaire you describe inflammation in the upper respiratory tract which is causing a significant cough.  This is commonly called Bronchitis and has four common causes:   Allergies Viral Infections Acid Reflux Bacterial Infection Allergies, viruses and acid reflux are treated by controlling symptoms or eliminating the cause. An example might be a cough caused by taking certain blood pressure medications. You stop the cough by changing the medication. Another example might be a cough caused by acid reflux. Controlling the reflux helps control the cough.  USE OF BRONCHODILATOR ("RESCUE") INHALERS: There is a risk from using your bronchodilator too frequently.  The risk is that over-reliance on a medication which only relaxes the muscles surrounding the breathing tubes can reduce the effectiveness of medications prescribed to reduce swelling and congestion of the tubes themselves.  Although you feel brief relief from the bronchodilator inhaler, your asthma may actually be worsening with the tubes becoming more swollen and filled with mucus.  This can delay other crucial treatments, such as oral steroid medications. If you need to use a bronchodilator inhaler daily, several times per day, you should discuss this with your provider.  There are probably better treatments that could be used to keep your asthma under control.     HOME CARE Only take medications as  instructed by your medical team. Complete the entire course of an antibiotic. Drink plenty of fluids and get plenty of rest. Avoid close contacts especially the very young and the elderly Cover your mouth if you cough or cough into your sleeve. Always remember to wash your hands A steam or ultrasonic humidifier can help congestion.   GET HELP RIGHT AWAY IF: You develop worsening fever. You become short of breath You cough up blood. Your symptoms persist after you have completed your treatment plan MAKE SURE YOU  Understand these instructions. Will watch your condition. Will get help right away if you are not doing well or get worse.    Thank you for choosing an e-visit.  Your e-visit answers were reviewed by a board certified advanced clinical practitioner to complete your personal care plan. Depending upon the condition, your plan could have included both over the counter or prescription medications.  Please review your pharmacy choice. Make sure the pharmacy is open so you can pick up prescription now. If there is a problem, you may contact your provider through and have the prescription routed to another pharmacy.  Your safety is important to [employer]. If you have drug allergies check your prescription carefully.   For the next 24 hours you can use MyChart to ask questions about today's visit, request a non-urgent call back, or ask for a work or school excuse. You will get an email in the next two days asking about your experience. I hope that your e-visit has been valuable and will speed your recovery.  Mary-Margaret Hassell Done, FNP   5-10 minutes spent reviewing and documenting in chart.

## 2022-06-10 MED ORDER — ALBUTEROL SULFATE HFA 108 (90 BASE) MCG/ACT IN AERS: 2.0000 | INHALATION_SPRAY | Freq: Four times a day (QID) | RESPIRATORY_TRACT | 0 refills | Status: DC | PRN

## 2022-06-10 NOTE — Addendum Note (Signed)
Addended by: Freddy Finner on: 06/10/2022 09:29 AM   Modules accepted: Orders

## 2022-06-11 ENCOUNTER — Telehealth: Payer: Self-pay | Admitting: Internal Medicine

## 2022-06-11 ENCOUNTER — Ambulatory Visit (INDEPENDENT_AMBULATORY_CARE_PROVIDER_SITE_OTHER): Payer: 59 | Admitting: Internal Medicine

## 2022-06-11 ENCOUNTER — Encounter: Payer: Self-pay | Admitting: Internal Medicine

## 2022-06-11 VITALS — BP 120/80 | HR 84 | Temp 98.4°F

## 2022-06-11 DIAGNOSIS — J069 Acute upper respiratory infection, unspecified: Secondary | ICD-10-CM

## 2022-06-11 MED ORDER — BENZONATATE 100 MG PO CAPS: 100.0000 mg | ORAL_CAPSULE | Freq: Three times a day (TID) | ORAL | 0 refills | Status: DC | PRN

## 2022-06-11 MED ORDER — AZITHROMYCIN 250 MG PO TABS: ORAL_TABLET | ORAL | 0 refills | Status: AC

## 2022-06-11 MED ORDER — CEFTRIAXONE SODIUM 1 G IJ SOLR
1.0000 g | Freq: Once | INTRAMUSCULAR | Status: AC
  Administered 2022-06-11: 1 g via INTRAMUSCULAR

## 2022-06-11 MED ORDER — PREDNISONE 10 MG PO TABS: ORAL_TABLET | ORAL | 0 refills | Status: DC

## 2022-06-11 NOTE — Telephone Encounter (Signed)
COVID test is negative

## 2022-06-11 NOTE — Progress Notes (Signed)
   Subjective:    Patient ID: Christine Fischer, female    DOB: 05/11/1976, 46 y.o.   MRN: [id number]  HPI 46 year old Female  seen recently by e-visit on December 16 and diagnosed with viral URI with cough.  Was treated with prednisone 40 mg daily for 5 days and given albuterol inhaler.  She has not improved.  COVID test today is negative.  Says she is continuing to have cough, malaise and fatigue.  Her general health is excellent.  She does not smoke.  She stays in great physical condition.  She has no history of chronic medical issues.  Review of Systems 2 children had pneumonia- one had it in in October and one just recently     Objective:   Physical Exam Temperature 98.4 degrees, pulse 84 regular, blood pressure 120/80 pulse oximetry 98% on room air Skin: Warm and dry.  Nodes none.  TMs are clear.  Pharynx is clear.  Neck is supple.  Chest is clear to auscultation.  No rales or wheezing noted.  She is in no acute distress and is not tachypnea.      Assessment & Plan:  Acute viral respiratory infection  Plan: Patient given Tessalon Perles 100 mg to take up to 3 times daily as needed for cough.  Albuterol inhaler 2 sprays by mouth 4 times daily until improved.  Tapering course of prednisone 10 mg tablets starting with 6 tablets day 1 and decreasing by 1 tablet daily i.e. 6-5-4-3-2-1 taper.  Rest and stay well-hydrated.  Call if not improving in 48 hours or sooner if worse.  1 g IM Rocephin given in office today.

## 2022-06-11 NOTE — Telephone Encounter (Signed)
Christine Fischer [phone]  Christine Fischer sent Mychart message wanting to be seen to get cough under control, she was seen at e-visit on Saturday for URI with cough. Looks like they gave her prednisone for 5 days and albuterol inhaler. I have ask her to do a COVID test and call me back and I have scheduled her for 12:30 today.

## 2022-06-22 NOTE — Patient Instructions (Addendum)
Take prednisone in tapering course as directed starting with 6 to 10 mg tablets on day 1 and decreasing by 1 tablet daily.  Albuterol inhaler prescribed to use 2 sprays by mouth 4 times daily until improved.  Tessalon Perles 100 mg to take up to 3 times daily as needed for cough.  Stay well-hydrated.  Rest and call if not better in 48 hours or sooner if worse.  It was a pleasure to see you today.  We hope you feel better soon.  1 g IM Rocephin given in office today.

## 2022-06-23 NOTE — Patient Instructions (Signed)
Hydrocortisone cream 2.5% rectal area 2 or 3 times daily.  Recommend sitz bath's for 20 minutes daily for several days.  Call if not improving.

## 2023-06-23 ENCOUNTER — Telehealth: Payer: 59 | Admitting: Physician Assistant

## 2023-06-23 DIAGNOSIS — B9689 Other specified bacterial agents as the cause of diseases classified elsewhere: Secondary | ICD-10-CM

## 2023-06-23 DIAGNOSIS — J019 Acute sinusitis, unspecified: Secondary | ICD-10-CM | POA: Diagnosis not present

## 2023-06-23 MED ORDER — AMOXICILLIN-POT CLAVULANATE 875-125 MG PO TABS: 1.0000 | ORAL_TABLET | Freq: Two times a day (BID) | ORAL | 0 refills | Status: DC

## 2023-06-23 MED ORDER — PREDNISONE 20 MG PO TABS: 40.0000 mg | ORAL_TABLET | Freq: Every day | ORAL | 0 refills | Status: DC

## 2023-06-23 NOTE — Progress Notes (Signed)

## 2023-07-22 ENCOUNTER — Telehealth: Payer: Self-pay | Admitting: Internal Medicine

## 2023-07-22 ENCOUNTER — Telehealth: Payer: 59 | Admitting: Family Medicine

## 2023-07-22 ENCOUNTER — Encounter: Payer: Self-pay | Admitting: Internal Medicine

## 2023-07-22 ENCOUNTER — Ambulatory Visit (INDEPENDENT_AMBULATORY_CARE_PROVIDER_SITE_OTHER): Payer: 59 | Admitting: Internal Medicine

## 2023-07-22 VITALS — BP 120/80 | HR 87 | Temp 99.3°F | Ht 68.0 in | Wt 160.0 lb

## 2023-07-22 DIAGNOSIS — J22 Unspecified acute lower respiratory infection: Secondary | ICD-10-CM | POA: Diagnosis not present

## 2023-07-22 DIAGNOSIS — J209 Acute bronchitis, unspecified: Secondary | ICD-10-CM

## 2023-07-22 MED ORDER — PROMETHAZINE-DM 6.25-15 MG/5ML PO SYRP: 5.0000 mL | ORAL_SOLUTION | Freq: Four times a day (QID) | ORAL | 0 refills | Status: AC | PRN

## 2023-07-22 MED ORDER — ALBUTEROL SULFATE HFA 108 (90 BASE) MCG/ACT IN AERS: 1.0000 | INHALATION_SPRAY | Freq: Four times a day (QID) | RESPIRATORY_TRACT | 0 refills | Status: AC | PRN

## 2023-07-22 MED ORDER — CEFTRIAXONE SODIUM 1 G IJ SOLR
1.0000 g | Freq: Once | INTRAMUSCULAR | Status: AC
  Administered 2023-07-22: 1 g via INTRAMUSCULAR

## 2023-07-22 MED ORDER — AZITHROMYCIN 250 MG PO TABS: ORAL_TABLET | ORAL | 0 refills | Status: AC

## 2023-07-22 MED ORDER — BENZONATATE 100 MG PO CAPS: 100.0000 mg | ORAL_CAPSULE | Freq: Three times a day (TID) | ORAL | 0 refills | Status: AC | PRN

## 2023-07-22 NOTE — Telephone Encounter (Signed)
LVM for patient to call back to see if she needs an in office visit since she was sick last month and this month.

## 2023-07-22 NOTE — Progress Notes (Signed)
Patient Care Team: Christine Mackintosh, MD as PCP - General (Internal Medicine)  Visit Date: 07/22/23  Subjective:   Chief Complaint  Patient presents with   Cough    Since Friday, chest feels cold.    Patient CW:CBJSEGB [name],Female DOB:1976/05/08,47 y.o. TDV:[id number]   48 y.o. Female presents today for acute sick visit with Cough, Nasal Congestion, & Left-Sided Chest Pain attributed to cough. Seen 06/23/23 by Joycelyn Man, PA-C for acute bacterial sinusitis - given 875-125 mg Augmentin and Prednisone tapering course. Seen today over e-visit today by Tereasa Coop, NP - given Albuterol inhaler, promethazine-DM, and tessalon perles. Has been around her daughter who recently had a fever.   No Known Allergies Past Medical History:  Diagnosis Date   GERD (gastroesophageal reflux disease)    Hx of pyelonephritis    Hx of varicella     Family History  Problem Relation Age of Onset   Cancer Mother        leaukemia   Atrial fibrillation Father    Cancer Father        prostate   Cancer Maternal Aunt        breast   Cerebral palsy Maternal Aunt    Birth defects Daughter        VSD ASD no surgery   Atrial fibrillation Paternal Grandmother    Social History   Social History Narrative   Not on file   Review of Systems  Constitutional:  Negative for fever and malaise/fatigue.  HENT:  Positive for congestion.   Eyes:  Negative for blurred vision.  Respiratory:  Positive for cough. Negative for shortness of breath.   Cardiovascular:  Positive for chest pain (left-side, attributed to cough). Negative for palpitations and leg swelling.  Gastrointestinal:  Negative for vomiting.  Musculoskeletal:  Negative for back pain.  Skin:  Negative for rash.  Neurological:  Negative for loss of consciousness and headaches.     Objective:  Vitals: BP 120/80   Pulse 87   Temp 99.3 F (37.4 C)   Ht 5\' 8"  (1.727 m)   Wt 160 lb (72.6 kg)   SpO2 97%   BMI 24.33 kg/m   Physical  Exam Vitals and nursing note reviewed.  Constitutional:      General: She is not in acute distress.    Appearance: Normal appearance. She is not toxic-appearing.  HENT:     Head: Normocephalic and atraumatic.     Right Ear: Hearing, tympanic membrane, ear canal and external ear normal.     Ears:     Comments: Left ear dull, not red    Mouth/Throat:     Pharynx: Posterior oropharyngeal erythema (slight) present.  Pulmonary:     Effort: Pulmonary effort is normal.     Breath sounds: Normal breath sounds. No wheezing.  Skin:    General: Skin is warm and dry.  Neurological:     Mental Status: She is alert and oriented to person, place, and time. Mental status is at baseline.  Psychiatric:        Mood and Affect: Mood normal.        Behavior: Behavior normal.        Thought Content: Thought content normal.        Judgment: Judgment normal.     Results:  Studies Obtained And Personally Reviewed By Me: Labs:     Component Value Date/Time   NA 139 08/02/2019 0915   K 3.7 08/02/2019 0915   CL 104  08/02/2019 0915   CO2 26 08/02/2019 0915   GLUCOSE 98 08/02/2019 0915   BUN 8 08/02/2019 0915   CREATININE 0.79 08/02/2019 0915   CALCIUM 9.6 08/02/2019 0915   PROT 6.7 08/02/2019 0915   AST 16 08/02/2019 0915   ALT 11 08/02/2019 0915   BILITOT 0.8 08/02/2019 0915   GFRNONAA 93 08/02/2019 0915   GFRAA 108 08/02/2019 0915    Lab Results  Component Value Date   WBC 6.7 08/02/2019   HGB 12.8 08/02/2019   HCT 37.6 08/02/2019   MCV 90.0 08/02/2019   PLT 285 08/02/2019   Lab Results  Component Value Date   CHOL 160 08/02/2019   HDL 83 08/02/2019   LDLCALC 60 08/02/2019   TRIG 85 08/02/2019   CHOLHDL 1.9 08/02/2019   Lab Results  Component Value Date   TSH 3.16 08/02/2019    No results found for any visits on 07/22/23.  Assessment & Plan:   Sinusitis; Acute Upper Respiratory Infection: has inhaler, tessalon or promethazine-DM for cough. Given 1 g Rocephin IM. Sending in  250 mg Azithromycin - take 2 tablets on Day 1 and 1 tablet on Days 2-5. Stay well rested, well hydrated, and well nourished. Contact us if symptoms worsen/persist despite treatment.     I,Christine Fischer,acting as a Neurosurgeon for Christine Mackintosh, MD.,have documented all relevant documentation on the behalf of Christine Mackintosh, MD,as directed by  Christine Mackintosh, MD while in the presence of Christine Mackintosh, MD.   I, Christine Mackintosh, MD, have reviewed all documentation for this visit. The documentation on 07/22/23 for the exam, diagnosis, procedures, and orders are all accurate and complete.

## 2023-07-22 NOTE — Patient Instructions (Signed)
May use inhaler and take Tessalon perles or Promethazine DM for cough. Rest and stay well hydrated. Given one gram IM Rocephin in office. Take Zithromax Z-pak 2 tabs day 1 followed by one tab days 2-5.

## 2023-07-22 NOTE — E-Visit Routing Comment (Signed)
LVM for patient to call office.

## 2023-07-22 NOTE — Telephone Encounter (Signed)
Patient called back, scheduled for this afternoon.

## 2023-07-22 NOTE — Progress Notes (Signed)
E-Visit for Cough   We are sorry that you are not feeling well.  Here is how we plan to help!  Based on your presentation I believe you most likely have A cough due to a virus.  This is called viral bronchitis and is best treated by rest, plenty of fluids and control of the cough.  You may use Ibuprofen or Tylenol as directed to help your symptoms.     In addition you may use A non-prescription cough medication called Robitussin DAC. Take 2 teaspoons every 8 hours or Delsym: take 2 teaspoons every 12 hours. and A prescription cough medication called Tessalon Perles 100mg . You may take 1-2 capsules every 8 hours as needed for your cough.  And a cough syrup= Promethazine Dm  An inhaler use as directed   From your responses in the eVisit questionnaire you describe inflammation in the upper respiratory tract which is causing a significant cough.  This is commonly called Bronchitis and has four common causes:   Allergies Viral Infections Acid Reflux Bacterial Infection Allergies, viruses and acid reflux are treated by controlling symptoms or eliminating the cause. An example might be a cough caused by taking certain blood pressure medications. You stop the cough by changing the medication. Another example might be a cough caused by acid reflux. Controlling the reflux helps control the cough.  USE OF BRONCHODILATOR ("RESCUE") INHALERS: There is a risk from using your bronchodilator too frequently.  The risk is that over-reliance on a medication which only relaxes the muscles surrounding the breathing tubes can reduce the effectiveness of medications prescribed to reduce swelling and congestion of the tubes themselves.  Although you feel brief relief from the bronchodilator inhaler, your asthma may actually be worsening with the tubes becoming more swollen and filled with mucus.  This can delay other crucial treatments, such as oral steroid medications. If you need to use a bronchodilator inhaler  daily, several times per day, you should discuss this with your provider.  There are probably better treatments that could be used to keep your asthma under control.     HOME CARE Only take medications as instructed by your medical team. Complete the entire course of an antibiotic. Drink plenty of fluids and get plenty of rest. Avoid close contacts especially the very young and the elderly Cover your mouth if you cough or cough into your sleeve. Always remember to wash your hands A steam or ultrasonic humidifier can help congestion.   GET HELP RIGHT AWAY IF: You develop worsening fever. You become short of breath You cough up blood. Your symptoms persist after you have completed your treatment plan MAKE SURE YOU  Understand these instructions. Will watch your condition. Will get help right away if you are not doing well or get worse.    Thank you for choosing an e-visit.  Your e-visit answers were reviewed by a board certified advanced clinical practitioner to complete your personal care plan. Depending upon the condition, your plan could have included both over the counter or prescription medications.  Please review your pharmacy choice. Make sure the pharmacy is open so you can pick up prescription now. If there is a problem, you may contact your provider through [employer] and have the prescription routed to another pharmacy.  Your safety is important to [place]. If you have drug allergies check your prescription carefully.   For the next 24 hours you can use MyChart to ask questions about today's visit, request a non-urgent call back,  or ask for a work or school excuse. You will get an email in the next two days asking about your experience. I hope that your e-visit has been valuable and will speed your recovery.  I provided 5 minutes of non face-to-face time during this encounter for chart review, medication and order placement, as well as and documentation.

## 2023-12-15 ENCOUNTER — Telehealth: Admitting: Family Medicine

## 2023-12-15 NOTE — Progress Notes (Signed)
 You will need to make the appt for him under his chart. We can not see or treat him under your name.    Kind Regards.   You will not be charged for this EV
